# Patient Record
Sex: Male | Born: 1975 | Race: Black or African American | Hispanic: No | Smoking: Never smoker
Health system: Southern US, Community
[De-identification: ages and names within clinical notes are randomized; demographics above are authoritative.]

## PROBLEM LIST (undated history)

## (undated) DIAGNOSIS — I10 Essential (primary) hypertension: Secondary | ICD-10-CM

## (undated) HISTORY — DX: Essential (primary) hypertension: I10

---
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
## 1999-07-28 ENCOUNTER — Emergency Department (HOSPITAL_COMMUNITY): Admission: EM | Admit: 1999-07-28 | Discharge: 1999-07-28 | Payer: Self-pay | Admitting: Emergency Medicine

## 1999-08-01 ENCOUNTER — Emergency Department (HOSPITAL_COMMUNITY): Admission: EM | Admit: 1999-08-01 | Discharge: 1999-08-01 | Payer: Self-pay | Admitting: Internal Medicine

## 2004-12-22 ENCOUNTER — Emergency Department (HOSPITAL_COMMUNITY): Admission: EM | Admit: 2004-12-22 | Discharge: 2004-12-22 | Payer: Self-pay | Admitting: Emergency Medicine

## 2007-09-21 ENCOUNTER — Emergency Department (HOSPITAL_COMMUNITY): Admission: EM | Admit: 2007-09-21 | Discharge: 2007-09-21 | Payer: Self-pay | Admitting: Emergency Medicine

## 2007-09-22 ENCOUNTER — Emergency Department (HOSPITAL_COMMUNITY): Admission: EM | Admit: 2007-09-22 | Discharge: 2007-09-22 | Payer: Self-pay | Admitting: Emergency Medicine

## 2009-03-15 ENCOUNTER — Encounter: Admission: RE | Admit: 2009-03-15 | Discharge: 2009-03-15 | Payer: Self-pay | Admitting: Orthopaedic Surgery

## 2009-06-26 ENCOUNTER — Emergency Department (HOSPITAL_COMMUNITY): Admission: EM | Admit: 2009-06-26 | Discharge: 2009-06-26 | Payer: Self-pay | Admitting: Emergency Medicine

## 2014-01-21 ENCOUNTER — Encounter (HOSPITAL_COMMUNITY): Payer: Self-pay | Admitting: Emergency Medicine

## 2014-01-21 ENCOUNTER — Emergency Department (HOSPITAL_COMMUNITY)
Admission: EM | Admit: 2014-01-21 | Discharge: 2014-01-21 | Disposition: A | Discharge status: Home / Self Care | Payer: BC Managed Care – PPO | Attending: Emergency Medicine | Admitting: Emergency Medicine

## 2014-01-21 DIAGNOSIS — IMO0002 Reserved for concepts with insufficient information to code with codable children: Secondary | ICD-10-CM | POA: Insufficient documentation

## 2014-01-21 DIAGNOSIS — J0100 Acute maxillary sinusitis, unspecified: Secondary | ICD-10-CM | POA: Insufficient documentation

## 2014-01-21 DIAGNOSIS — Z79899 Other long term (current) drug therapy: Secondary | ICD-10-CM | POA: Insufficient documentation

## 2014-01-21 MED ORDER — PREDNISONE 10 MG PO TABS: 10.0000 mg | ORAL_TABLET | Freq: Every day | ORAL | Status: DC

## 2014-01-21 MED ORDER — IBUPROFEN 800 MG PO TABS
800.0000 mg | ORAL_TABLET | Freq: Once | ORAL | Status: AC
  Administered 2014-01-21: 800 mg via ORAL
  Filled 2014-01-21: qty 1

## 2014-01-21 NOTE — ED Provider Notes (Signed)
Medical screening examination/treatment/procedure(s) were performed by non-physician practitioner and as supervising physician I was immediately available for consultation/collaboration.    Lejuan Botto, MD 01/21/14 0518 

## 2014-01-21 NOTE — ED Notes (Signed)
Pt recently seen at dr for sinus pressure and given prednisone. Pt states that tonight the pressure began building back up and that he is unable to sleep. Pt reports that he can breath normal just the pressure is causing him pain.

## 2014-01-21 NOTE — Discharge Instructions (Signed)
Continue your medications to help with your sinus congestion. Use decongestant medicines to also help with your congestion. Followup with a primary care provider or an Ear, Nose and Throat specialist for continued evaluation and treatment.    Sinusitis Sinusitis is redness, soreness, and puffiness (inflammation) of the air pockets in the bones of your face (sinuses). The redness, soreness, and puffiness can cause air and mucus to get trapped in your sinuses. This can allow germs to grow and cause an infection.  HOME CARE   Drink enough fluids to keep your pee (urine) clear or pale yellow.  Use a humidifier in your home.  Run a hot shower to create steam in the bathroom. Sit in the bathroom with the door closed. Breathe in the steam 3 4 times a day.  Put a warm, moist washcloth on your face 3 4 times a day, or as told by your doctor.  Use salt water sprays (saline sprays) to wet the thick fluid in your nose. This can help the sinuses drain.  Only take medicine as told by your doctor. GET HELP RIGHT AWAY IF:   Your pain gets worse.  You have very bad headaches.  You are sick to your stomach (nauseous).  You throw up (vomit).  You are very sleepy (drowsy) all the time.  Your face is puffy (swollen).  Your vision changes.  You have a stiff neck.  You have trouble breathing. MAKE SURE YOU:   Understand these instructions.  Will watch your condition.  Will get help right away if you are not doing well or get worse. Document Released: 05/29/2008 Document Revised: 09/04/2012 Document Reviewed: 07/16/2012 [facility] Patient Information 2014 [place], [place].

## 2014-01-21 NOTE — ED Provider Notes (Signed)
CSN: [id number]     Arrival date & time 01/21/14  0131 History   First MD Initiated Contact with Patient 01/21/14 0155     Chief Complaint  Patient presents with  . Sinus Problem   HPI  History provided by the patient. Patient's 38 year old male who presents with complaints of return of left maxillary sinus pressure and pain. Patient states that he has a long history of some allergies and sinus problems. He has recently had sinus congestion and pressure mostly in the left maxillary sinus for the past several weeks. He was seen in urgent care one week ago. He has been using amoxicillin with 2 pills left as well as Flonase. He was also given a prednisone dose pack which he reports has helped significantly. His symptoms have returned with pressure and tightness this evening and early this morning. He denies having any significant nasal discharge or drainage. There is no associated cough symptoms. No fever, chills or sweats.    History reviewed. No pertinent past medical history. History reviewed. No pertinent past surgical history. No family history on file. History  Substance Use Topics  . Smoking status: Never Smoker   . Smokeless tobacco: Not on file  . Alcohol Use: No    Review of Systems  Constitutional: Negative for fever and chills.  HENT: Positive for sinus pressure.   Respiratory: Negative for cough.   All other systems reviewed and are negative.    Allergies  Review of patient's allergies indicates no known allergies.  Home Medications   Current Outpatient Rx  Name  Route  Sig  Dispense  Refill  . amoxicillin-clavulanate (AUGMENTIN) 875-125 MG per tablet   Oral   Take 1 tablet by mouth 2 (two) times daily.         . fluticasone (FLONASE) 50 MCG/ACT nasal spray   Nasal   Place 1 spray into the nose daily.         . sodium chloride (OCEAN) 0.65 % SOLN nasal spray   Each Nare   Place 1 spray into both nostrils at bedtime.          BP 134/77  Pulse 54   Temp(Src) 97.5 F (36.4 C) (Oral)  Resp 16  SpO2 100% Physical Exam  Nursing note and vitals reviewed. Constitutional: He is oriented to person, place, and time. He appears well-developed and well-nourished. No distress.  HENT:  Head: Normocephalic.  Right Ear: Tympanic membrane normal.  Left Ear: Tympanic membrane normal.  Nose: No mucosal edema or rhinorrhea. Left sinus exhibits maxillary sinus tenderness.  Mouth/Throat: Oropharynx is clear and moist.  Cardiovascular: Normal rate and regular rhythm.   Pulmonary/Chest: Effort normal and breath sounds normal. No respiratory distress. He has no wheezes.  Neurological: He is alert and oriented to person, place, and time.  Skin: Skin is warm.  Psychiatric: He has a normal mood and affect. His behavior is normal.    ED Course  Procedures   DIAGNOSTIC STUDIES: Oxygen Saturation is 100% on room air.    COORDINATION OF CARE:  Nursing notes reviewed. Vital signs reviewed. Initial pt interview and examination performed.   2:11 AM-patient seen and evaluated. He is well-appearing no acute distress.he does not appear severely ill or toxic.    MDM   1. Sinusitis, acute, maxillary        Angus Sellereter S Mehgan Santmyer, PA-C 01/21/14 0222

## 2014-12-30 ENCOUNTER — Encounter (HOSPITAL_COMMUNITY): Payer: Self-pay | Admitting: Emergency Medicine

## 2014-12-30 ENCOUNTER — Emergency Department (HOSPITAL_COMMUNITY)
Admission: EM | Admit: 2014-12-30 | Discharge: 2014-12-30 | Disposition: A | Discharge status: Home / Self Care | Payer: BC Managed Care – PPO | Source: Home / Self Care | Attending: Family Medicine | Admitting: Family Medicine

## 2014-12-30 DIAGNOSIS — L02214 Cutaneous abscess of groin: Secondary | ICD-10-CM

## 2014-12-30 MED ORDER — SULFAMETHOXAZOLE-TRIMETHOPRIM 800-160 MG PO TABS: 1.0000 | ORAL_TABLET | Freq: Two times a day (BID) | ORAL | Status: AC

## 2014-12-30 NOTE — ED Notes (Signed)
Reports possible staff infection of the left groin.  On set Sunday.  Denies any other symptoms.  No fever.  No otc treatments tried.

## 2014-12-30 NOTE — Discharge Instructions (Signed)
Abscess An abscess (boil or furuncle) is an infected area on or under the skin. This area is filled with yellowish-white fluid (pus) and other material (debris). HOME CARE   Only take medicines as told by your doctor.  If you were given antibiotic medicine, take it as directed. Finish the medicine even if you start to feel better.  If gauze is used, follow your doctor's directions for changing the gauze.  To avoid spreading the infection:  Keep your abscess covered with a bandage.  Wash your hands well.  Do not share personal care items, towels, or whirlpools with others.  Avoid skin contact with others.  Keep your skin and clothes clean around the abscess.  Keep all doctor visits as told. GET HELP RIGHT AWAY IF:   You have more pain, puffiness (swelling), or redness in the wound site.  You have more fluid or blood coming from the wound site.  You have muscle aches, chills, or you feel sick.  You have a fever. MAKE SURE YOU:   Understand these instructions.  Will watch your condition.  Will get help right away if you are not doing well or get worse. Document Released: 05/29/2008 Document Revised: 06/11/2012 Document Reviewed: 02/23/2012 [facility] Patient Information 2015 [place], [place]. This information is not intended to replace advice given to you by your health care provider. Make sure you discuss any questions you have with your health care provider.  Community-Associated MRSA CA-MRSA stands for community-associated methicillin-resistant Staphylococcus aureus. MRSA is a type of bacteria that is resistant to some common antibiotics. It can cause infections in the skin and many other places in the body. Staphylococcus aureus, often called "staph," is a bacteria that normally lives on the skin or in the nose. Staph on the surface of the skin or in the nose does not cause problems. However, if the staph enters the body through a cut, wound, or break in the skin, an  infection can happen. Up until recently, infections with the MRSA type of staph mainly occurred in hospitals and other health care settings. There are now increasing problems with MRSA infections in the community as well. Infections with MRSA may be very serious or even life threatening. CA-MRSA is becoming more common. It is known to spread in crowded settings, in jails and prisons, and in situations where there is close skin-to-skin contact, such as during sporting events or in locker rooms. MRSA can be spread through shared items, such as children's toys, razors, towels, or sports equipment.  CAUSES All staph, including MRSA, are normally harmless unless they enter the body through a scratch, cut, or wound, such as with surgery. All staph, including MRSA, can be spread from person-to-person by touching contaminated objects or through direct contact.  MRSA now causes illness in people who have not been in hospitals or other health care facilities. Cases of MRSA diseases in the community have been associated with:  Recent antibiotic use.  Sharing contaminated towels or clothes.  Having active skin diseases.  Participating in contact sports.  Living in crowded settings.  Intravenous (IV) drug use.  Community-associated MRSA infections are usually skin infections, but may cause other severe illnesses.  Staph bacteria are one of the most common causes of skin infection. However, they are also a common cause of pneumonia, bone or joint infections, and bloodstream infections. DIAGNOSIS Diagnosis of MRSA is done by cultures of fluid samples that may come from:  Swabs taken from cuts or wounds in infected areas.  Nasal  swabs.  Saliva or deep cough specimens from the lungs (sputum).  Urine.  Blood. Many people are "colonized" with MRSA but have no signs of infection. This means that people carry the MRSA germ on their skin or in their nose and may never develop MRSA infection.  TREATMENT   Warm Moist Compresses Frequently Treatment varies and is based on how serious, how deep, or how extensive the infection is. For example:  Some skin infections, such as a small boil or abscess, may be treated by draining yellowish-white fluid (pus) from the site of the infection.  Deeper or more widespread soft tissue infections are usually treated with surgery to drain pus and with antibiotic medicine given by vein or by mouth. This may be recommended even if you are pregnant.  Serious infections may require a hospital stay. If antibiotics are given, they may be needed for several weeks. PREVENTION Because many people are colonized with staph, including MRSA, preventing the spread of the bacteria from person-to-person is most important. The best way to prevent the spread of bacteria and other germs is through proper hand washing or by using alcohol-based hand disinfectants. The following are other ways to help prevent MRSA infection within community settings.   Wash your hands frequently with soap and water for at least 15 seconds. Otherwise, use alcohol-based hand disinfectants when soap and water is not available.  Make sure people who live with you wash their hands often, too.  Do not share personal items. For example, avoid sharing razors and other personal hygiene items, towels, clothing, and athletic equipment.  Wash and dry your clothes and bedding at the warmest temperatures recommended on the labels.  Keep wounds covered. Pus from infected sores may contain MRSA and other bacteria. Keep cuts and abrasions clean and covered with germ-free (sterile), dry bandages until they are healed.  If you have a wound that appears infected, ask your caregiver if a culture for MRSA and other bacteria should be done.  If you are breastfeeding, talk to your caregiver about MRSA. You may be asked to temporarily stop breastfeeding. HOME CARE INSTRUCTIONS   Take your antibiotics as directed. Finish  them even if you start to feel better.  Avoid close contact with those around you as much as possible. Do not use towels, razors, toothbrushes, bedding, or other items that will be used by others.  To fight the infection, follow your caregiver's instructions for wound care. Wash your hands before and after changing your bandages.  If you have an intravascular device, such as a catheter, make sure you know how to care for it.  Be sure to tell any health care providers that you have MRSA so they are aware of your infection. SEEK IMMEDIATE MEDICAL CARE IF:  The infection appears to be getting worse. Signs include:  Increased warmth, redness, or tenderness around the wound site.  A red line that extends from the infection site.  A dark color in the area around the infection.  Wound drainage that is tan, yellow, or green.  A bad smell coming from the wound.  You feel sick to your stomach (nauseous) and throw up (vomit) or cannot keep medicine down.  You have a fever.  Your baby is older than 3 months with a rectal temperature of 102F (38.9C) or higher.  Your baby is 45 months old or younger with a rectal temperature of 100.56F (38C) or higher.  You have difficulty breathing. MAKE SURE YOU:   Understand these instructions.  Will watch your condition.  Will get help right away if you are not doing well or get worse. Document Released: 03/16/2006 Document Revised: 04/27/2014 Document Reviewed: 03/16/2011 [facility] Patient Information 2015 [place], [place]. This information is not intended to replace advice given to you by your health care provider. Make sure you discuss any questions you have with your health care provider.

## 2014-12-30 NOTE — ED Provider Notes (Signed)
CSN: [id number]     Arrival date & time 12/30/14  1235 History   First MD Initiated Contact with Patient 12/30/14 1254     Chief Complaint  Patient presents with  . Groin Pain     possible staff infection per pt   (Consider location/radiation/quality/duration/timing/severity/associated sxs/prior Treatment) HPI Comments: 39 year old male presents with a firm, indurated tender area to the left groin that began approximately 3 days ago as a small pimple. During this time it had drained a small amount of pus. The patient states it is not as large as it was. It is located in the left groin crease.   History reviewed. No pertinent past medical history. History reviewed. No pertinent past surgical history. History reviewed. No pertinent family history. History  Substance Use Topics  . Smoking status: Never Smoker   . Smokeless tobacco: Not on file  . Alcohol Use: No    Review of Systems  Constitutional: Negative for fever.  Skin: Negative for color change.  All other systems reviewed and are negative.   Allergies  Review of patient's allergies indicates no known allergies.  Home Medications   Prior to Admission medications   Medication Sig Start Date End Date Taking? Authorizing Provider  amoxicillin-clavulanate (AUGMENTIN) 875-125 MG per tablet Take 1 tablet by mouth 2 (two) times daily.    Historical Provider, MD  fluticasone (FLONASE) 50 MCG/ACT nasal spray Place 1 spray into the nose daily.    Historical Provider, MD  predniSONE (DELTASONE) 10 MG tablet Take 1 tablet (10 mg total) by mouth daily. Take 6 tabs on day one, take 5 tablets on day 2, take 4 tabs on day 3, take 3 tabs on day 4, take 2 tabs on day 5, take 1 tab Monday 6 01/21/14   Phill MutterPeter S Dammen, PA-C  sodium chloride (OCEAN) 0.65 % SOLN nasal spray Place 1 spray into both nostrils at bedtime.    Historical Provider, MD  sulfamethoxazole-trimethoprim (BACTRIM DS,SEPTRA DS) 800-160 MG per tablet Take 1 tablet by mouth 2  (two) times daily. 12/30/14 01/06/15  Hayden Rasmussenavid Markas Aldredge, NP   BP 113/72 mmHg  Pulse 71  Temp(Src) 98.2 F (36.8 C) (Oral)  Resp 16  SpO2 99% Physical Exam  Constitutional: He is oriented to person, place, and time. He appears well-developed and well-nourished. No distress.  Pulmonary/Chest: Effort normal. No respiratory distress.  Musculoskeletal: Normal range of motion. He exhibits no edema.  Neurological: He is alert and oriented to person, place, and time.  Skin: Skin is warm and dry.  There is a irregularly shaped approximately 2.5 cm x 4 cm area of superficial induration located in the left inguinal crease. It is slightly mobile and approximately 1 cm in depth. There is no area of drainage or bleeding.. No overlying erythema or lymphangitis. The genitals are not involved.  Nursing note and vitals reviewed.   ED Course  Procedures (including critical care time) Labs Review Labs Reviewed - No data to display  Imaging Review No results found.   MDM   1. Abscess of groin, left    Warm compresses Septra x 7 d Return if not improving or worse Position, appearance, size and palpation does not suggest this would benefit from I and D at this stage. Appears to be slowly resolving.      Hayden Rasmussenavid Lanett Lasorsa, NP 12/30/14 1346

## 2015-06-13 ENCOUNTER — Emergency Department (HOSPITAL_COMMUNITY)
Admission: EM | Admit: 2015-06-13 | Discharge: 2015-06-13 | Disposition: A | Discharge status: Home / Self Care | Payer: BC Managed Care – PPO | Attending: Emergency Medicine | Admitting: Emergency Medicine

## 2015-06-13 ENCOUNTER — Encounter (HOSPITAL_COMMUNITY): Payer: Self-pay | Admitting: Emergency Medicine

## 2015-06-13 DIAGNOSIS — R0981 Nasal congestion: Secondary | ICD-10-CM | POA: Insufficient documentation

## 2015-06-13 MED ORDER — FLUTICASONE PROPIONATE 50 MCG/ACT NA SUSP: 2.0000 | Freq: Every day | NASAL | Status: AC

## 2015-06-13 NOTE — ED Notes (Signed)
Pt arrived to the ED with a complaint of difficulty breathing.  Pt states he woke up with his nasal pasagge blocked and had difficulty breathing through his mouth.  Pt states incidences of this type have happen before but never to the effect of restricting his breathing.

## 2015-06-13 NOTE — ED Provider Notes (Signed)
CSN: [id number]     Arrival date & time 06/13/15  0344 History   First MD Initiated Contact with Patient 06/13/15 [phone]     Chief Complaint  Patient presents with  . Nasal Congestion     (Consider location/radiation/quality/duration/timing/severity/associated sxs/prior Treatment) The history is provided by medical records and the patient.    This is a 39 y.o. M with no significant PMH presenting to the ED for nasal congestion.  He states last night he awoke and his nose was "clogged up" and he could not breath through his nose.  He states this has happened before, but never in the middle of the night.  He states it scared him so he decided to come in.  He denies chest pain, SOB, palpitations, dizziness, weakness, cough, fever, sweats, chills, sore throat, ear pain, or PND.  No sick contacts. Patient did try saline nasal spray without relief.  VSS.  History reviewed. No pertinent past medical history. History reviewed. No pertinent past surgical history. History reviewed. No pertinent family history. History  Substance Use Topics  . Smoking status: Never Smoker   . Smokeless tobacco: Not on file  . Alcohol Use: No    Review of Systems  HENT: Positive for congestion.   All other systems reviewed and are negative.     Allergies  Review of patient's allergies indicates no known allergies.  Home Medications   Prior to Admission medications   Medication Sig Start Date End Date Taking? Authorizing Provider  sodium chloride (OCEAN) 0.65 % SOLN nasal spray Place 1 spray into both nostrils at bedtime.   Yes Historical Provider, MD   BP 123/81 mmHg  Pulse 70  Temp(Src) 98.1 F (36.7 C) (Oral)  Resp 20  SpO2 99%   Physical Exam  Constitutional: He is oriented to person, place, and time. He appears well-developed and well-nourished.  HENT:  Head: Normocephalic and atraumatic.  Right Ear: Tympanic membrane and ear canal normal.  Left Ear: Tympanic membrane and ear canal normal.   Nose: Mucosal edema present.  Mouth/Throat: Uvula is midline, oropharynx is clear and moist and mucous membranes are normal.  Turbinates swollen and mildly erythematous  Eyes: Conjunctivae and EOM are normal. Pupils are equal, round, and reactive to light.  Neck: Normal range of motion. Neck supple.  Cardiovascular: Normal rate, regular rhythm and normal heart sounds.   Pulmonary/Chest: Effort normal and breath sounds normal. No respiratory distress. He has no wheezes.  Musculoskeletal: Normal range of motion.  Neurological: He is alert and oriented to person, place, and time.  Skin: Skin is warm and dry.  Psychiatric: He has a normal mood and affect.  Nursing note and vitals reviewed.   ED Course  Procedures (including critical care time) Labs Review Labs Reviewed - No data to display  Imaging Review No results found.   EKG Interpretation None      MDM   Final diagnoses:  Nasal congestion   39 year old male here with nasal congestion that began in the middle the night. He states his nasal passages feel "clogged" and he is in blowing his nose frequently. He denies any chest pain, shortness of breath, cough, or fever. He does have signs of nasal congestion on exam, however doubt acute infectious pathology. Patient will be started on Flonase. He was encouraged follow-up with a primary care physician.  Discussed plan with patient, he/she acknowledged understanding and agreed with plan of care.  Return precautions given for new or worsening symptoms.  Garlon Hatchet,  PA-C 06/13/15 1194  Mirian Mo, MD 06/14/15 [phone]

## 2015-06-13 NOTE — Discharge Instructions (Signed)
Take the prescribed medication as directed. Follow-up with a primary care physician or the wellness clinic if continue having issues with nasal congestion.

## 2020-12-04 ENCOUNTER — Other Ambulatory Visit: Payer: Self-pay

## 2020-12-04 ENCOUNTER — Emergency Department
Admission: EM | Admit: 2020-12-04 | Discharge: 2020-12-04 | Disposition: A | Discharge status: Home / Self Care | Payer: BC Managed Care – PPO | Attending: Emergency Medicine | Admitting: Emergency Medicine

## 2020-12-04 ENCOUNTER — Emergency Department: Payer: BC Managed Care – PPO

## 2020-12-04 DIAGNOSIS — R0789 Other chest pain: Secondary | ICD-10-CM | POA: Diagnosis present

## 2020-12-04 DIAGNOSIS — R079 Chest pain, unspecified: Secondary | ICD-10-CM

## 2020-12-04 DIAGNOSIS — I10 Essential (primary) hypertension: Secondary | ICD-10-CM | POA: Diagnosis not present

## 2020-12-04 LAB — BASIC METABOLIC PANEL
Anion gap: 9 (ref 5–15)
BUN: 19 mg/dL (ref 6–20)
CO2: 23 mmol/L (ref 22–32)
Calcium: 9.3 mg/dL (ref 8.9–10.3)
Chloride: 105 mmol/L (ref 98–111)
Creatinine, Ser: 1.06 mg/dL (ref 0.61–1.24)
GFR, Estimated: >60 mL/min (ref >60)
Glucose, Bld: 108 mg/dL — ABNORMAL HIGH (ref 70–99)
Potassium: 3.9 mmol/L (ref 3.5–5.1)
Sodium: 137 mmol/L (ref 135–145)

## 2020-12-04 LAB — CBC
HCT: 41.6 % (ref 39.0–52.0)
Hemoglobin: 14.3 g/dL (ref 13.0–17.0)
MCH: 27.8 pg (ref 26.0–34.0)
MCHC: 34.4 g/dL (ref 30.0–36.0)
MCV: 80.8 fL (ref 80.0–100.0)
Platelets: 239 10*3/uL (ref 150–400)
RBC: 5.15 MIL/uL (ref 4.22–5.81)
RDW: 13.4 % (ref 11.5–15.5)
WBC: 8.7 10*3/uL (ref 4.0–10.5)
nRBC: 0 % (ref 0.0–0.2)

## 2020-12-04 LAB — TROPONIN I (HIGH SENSITIVITY): Troponin I (High Sensitivity): 17 ng/L (ref <18)

## 2020-12-04 NOTE — ED Triage Notes (Signed)
Pt to ED POV for chief complaint of left sided chest tightness that started within the last hour with [facility] Denies N/V, dizziness Ambulatory to triage, NAD  Speaking in complete sentences, RR even and unlabored

## 2020-12-04 NOTE — ED Notes (Signed)
Pt ambulatory to treatment room. Steady gait noted.

## 2020-12-04 NOTE — ED Provider Notes (Signed)
[facility] Emergency Department Provider Note  ____________________________________________   Event Date/Time   First MD Initiated Contact with Patient 12/04/20 1804     (approximate)  I have reviewed the triage vital signs and the nursing notes.   HISTORY  Chief Complaint Chest Pain   HPI Joshua Lam is a 44 y.o. male without significant past medical history who presents for assessment of some left-sided chest tightness states with some shortness of breath and tingling in his neck that started about an hour prior to arrival.  Patient states the symptoms have resolved.  No prior similar episodes.  No clear alleviating aggravating factors any states he was driving when it began.  He does note he worked out earlier today and took his regular preworkout supplement including nitric oxide mix and something for "focus" he does not know the ingredients of.  He denies any other acute sick symptoms or current symptoms.  Denies any headache, earache, sore throat, fevers, chills, cough, nausea, vomiting, diarrhea, dysuria, rash or extremity pain.  No recent falls or injuries.  He denies tobacco abuse EtOH use or illicit drug use.         History reviewed. No pertinent past medical history.  There are no problems to display for this patient.   History reviewed. No pertinent surgical history.  Prior to Admission medications   Medication Sig Start Date End Date Taking? Authorizing Provider  fluticasone (FLONASE) 50 MCG/ACT nasal spray Place 2 sprays into both nostrils daily. 06/13/15   Garlon Hatchet, PA-C  sodium chloride (OCEAN) 0.65 % SOLN nasal spray Place 1 spray into both nostrils at bedtime.    [provider]    Allergies Patient has no known allergies.  No family history on file.  Social History Social History   Tobacco Use  . Smoking status: Never Smoker  Substance Use Topics  . Alcohol use: No  . Drug use: No    Review of  Systems  Review of Systems  Constitutional: Negative for chills and fever.  HENT: Negative for sore throat.   Eyes: Negative for pain.  Respiratory: Positive for shortness of breath. Negative for cough and stridor.   Cardiovascular: Positive for chest pain.  Gastrointestinal: Negative for vomiting.  Genitourinary: Negative for dysuria.  Musculoskeletal: Negative for myalgias.  Skin: Negative for rash.  Neurological: Negative for seizures, loss of consciousness and headaches.  Psychiatric/Behavioral: Negative for suicidal ideas.  All other systems reviewed and are negative.     ____________________________________________   PHYSICAL EXAM:  VITAL SIGNS: ED Triage Vitals  Enc Vitals Group     BP 12/04/20 1711 (!) 149/100     Pulse Rate 12/04/20 1711 86     Resp 12/04/20 1711 18     Temp 12/04/20 1711 98.6 F (37 C)     Temp Source 12/04/20 1711 Oral     SpO2 12/04/20 1711 97 %     Weight 12/04/20 1712 240 lb (108.9 kg)     Height 12/04/20 1712 5\' 11"  (1.803 m)     Head Circumference --      Peak Flow --      Pain Score 12/04/20 1712 0     Pain Loc --      Pain Edu? --      Excl. in GC? --    Vitals:   12/04/20 1711  BP: (!) 149/100  Pulse: 86  Resp: 18  Temp: 98.6 F (37 C)  SpO2: 97%   Physical  Exam Vitals and nursing note reviewed.  Constitutional:      Appearance: He is well-developed and well-nourished.  HENT:     Head: Normocephalic and atraumatic.     Right Ear: External ear normal.     Left Ear: External ear normal.     Nose: Nose normal.  Eyes:     Conjunctiva/sclera: Conjunctivae normal.  Cardiovascular:     Rate and Rhythm: Normal rate and regular rhythm.     Heart sounds: No murmur heard.   Pulmonary:     Effort: Pulmonary effort is normal. No respiratory distress.     Breath sounds: Normal breath sounds.  Abdominal:     Palpations: Abdomen is soft.     Tenderness: There is no abdominal tenderness.  Musculoskeletal:        General: No  edema.     Cervical back: Neck supple.  Skin:    General: Skin is warm and dry.     Capillary Refill: Capillary refill takes less than 2 seconds.  Neurological:     Mental Status: He is alert and oriented to person, place, and time.  Psychiatric:        Mood and Affect: Mood and affect and mood normal.      ____________________________________________   LABS (all labs ordered are listed, but only abnormal results are displayed)  Labs Reviewed  BASIC METABOLIC PANEL - Abnormal; Notable for the following components:      Result Value   Glucose, Bld 108 (*)    All other components within normal limits  CBC  TROPONIN I (HIGH SENSITIVITY)  TROPONIN I (HIGH SENSITIVITY)   ____________________________________________  EKG  Sinus rhythm with a ventricular rate of 86, normal axis, unremarkable intervals, T wave inversions in leads III, aVF and as nonspecific ST changes in leads V5 and V6. ____________________________________________  RADIOLOGY  ED MD interpretation: No focal consolidation, large effusion, no thorax, edema, or other acute thoracic process.  Official radiology report(s): DG Chest 2 View  Result Date: 12/04/2020 CLINICAL DATA:  Shortness of breath EXAM: CHEST - 2 VIEW COMPARISON:  None. FINDINGS: The heart size and mediastinal contours are within normal limits. Both lungs are clear. The visualized skeletal structures are unremarkable. IMPRESSION: No acute abnormality of the lungs. Electronically Signed   By: Lauralyn Primes M.D.   On: 12/04/2020 18:01    ____________________________________________   PROCEDURES  Procedure(s) performed (including Critical Care):  .1-3 Lead EKG Interpretation Performed by: Gilles Chiquito, MD Authorized by: Gilles Chiquito, MD     Interpretation: normal     ECG rate assessment: normal     Rhythm: sinus rhythm     Ectopy: none     Conduction: normal       ____________________________________________   INITIAL  IMPRESSION / ASSESSMENT AND PLAN / ED COURSE      Patient presents with post history exam for assessment of some chest pain shortness of breath that occurred earlier as above but resolved prior to arrival.  On arrival patient is hypertensive with BP of 139/100 with otherwise stable vital signs on room air.  Primary differential includes but is not limited to ACS, arrhythmia, PE, metabolic derangements, pneumothorax, pneumonia, anemia, and chest wall pain.  Description of pain is not consistent with myocarditis or pericarditis or recent traumatic injury  Chest x-ray shows evidence of pneumothorax or pneumonia.  CBC is unremarkable and has no evidence of significant anemia.  BMP is unremarkable.  While ECG does have some nonspecific changes  initial troponin is nonelevated. I will plan to obtain a repeat troponin.   No significant arrhythmia identified.  Low suspicion for PE as patient is PERC negative.  Troponins not consistent with myocarditis.  Pain is not clearly reproducible on exam I suspect may be coming from his chest wall a possible musculoskeletal etiology. Prior to obtaining second troponin patient stated he wished to leave against advice. He stated he understood could be missing evidence of cardiac ischemia but still wished to leave prior to obtaining a second troponin. We did everything I could recommend patient to stay and explained the concern for possible life-threatening etiologies for his pain he experienced earlier today but was able to do so. Do believe patient has capacity he was discharged in stable condition. Advised patient to follow-up with PCP and have patient's blood pressure rechecked as well in 7 to 10 days.  ____________________________________________   FINAL CLINICAL IMPRESSION(S) / ED DIAGNOSES  Final diagnoses:  Chest pain, unspecified type  Hypertension, unspecified type    Medications - No data to display   ED Discharge Orders    None       Note:  This  document was prepared using Dragon voice recognition software and may include unintentional dictation errors.   Gilles Chiquito, MD 12/04/20 [phone]

## 2022-05-03 ENCOUNTER — Encounter (HOSPITAL_COMMUNITY): Payer: Self-pay

## 2022-05-03 ENCOUNTER — Other Ambulatory Visit: Payer: Self-pay

## 2022-05-03 ENCOUNTER — Emergency Department (HOSPITAL_COMMUNITY)
Admission: EM | Admit: 2022-05-03 | Discharge: 2022-05-03 | Disposition: A | Discharge status: Home / Self Care | Payer: Self-pay | Attending: Emergency Medicine | Admitting: Emergency Medicine

## 2022-05-03 ENCOUNTER — Emergency Department (HOSPITAL_COMMUNITY): Payer: Self-pay

## 2022-05-03 DIAGNOSIS — R1032 Left lower quadrant pain: Secondary | ICD-10-CM | POA: Insufficient documentation

## 2022-05-03 DIAGNOSIS — R202 Paresthesia of skin: Secondary | ICD-10-CM | POA: Insufficient documentation

## 2022-05-03 LAB — CBC WITH DIFFERENTIAL/PLATELET
Abs Immature Granulocytes: 0.02 10*3/uL (ref 0.00–0.07)
Basophils Absolute: 0 10*3/uL (ref 0.0–0.1)
Basophils Relative: 1 %
Eosinophils Absolute: 0.3 10*3/uL (ref 0.0–0.5)
Eosinophils Relative: 4 %
HCT: 43 % (ref 39.0–52.0)
Hemoglobin: 14.1 g/dL (ref 13.0–17.0)
Immature Granulocytes: 0 %
Lymphocytes Relative: 42 %
Lymphs Abs: 3.1 10*3/uL (ref 0.7–4.0)
MCH: 27.9 pg (ref 26.0–34.0)
MCHC: 32.8 g/dL (ref 30.0–36.0)
MCV: 85 fL (ref 80.0–100.0)
Monocytes Absolute: 0.7 10*3/uL (ref 0.1–1.0)
Monocytes Relative: 9 %
Neutro Abs: 3.3 10*3/uL (ref 1.7–7.7)
Neutrophils Relative %: 44 %
Platelets: 231 10*3/uL (ref 150–400)
RBC: 5.06 MIL/uL (ref 4.22–5.81)
RDW: 13.7 % (ref 11.5–15.5)
WBC: 7.3 10*3/uL (ref 4.0–10.5)
nRBC: 0 % (ref 0.0–0.2)

## 2022-05-03 LAB — COMPREHENSIVE METABOLIC PANEL
ALT: 17 U/L (ref 0–44)
AST: 30 U/L (ref 15–41)
Albumin: 3.7 g/dL (ref 3.5–5.0)
Alkaline Phosphatase: 77 U/L (ref 38–126)
Anion gap: 7 (ref 5–15)
BUN: 17 mg/dL (ref 6–20)
CO2: 24 mmol/L (ref 22–32)
Calcium: 9.4 mg/dL (ref 8.9–10.3)
Chloride: 107 mmol/L (ref 98–111)
Creatinine, Ser: 1.13 mg/dL (ref 0.61–1.24)
GFR, Estimated: >60 mL/min (ref >60)
Glucose, Bld: 98 mg/dL (ref 70–99)
Potassium: 4.8 mmol/L (ref 3.5–5.1)
Sodium: 138 mmol/L (ref 135–145)
Total Bilirubin: 0.3 mg/dL (ref 0.3–1.2)
Total Protein: 6.9 g/dL (ref 6.5–8.1)

## 2022-05-03 NOTE — ED Provider Triage Note (Signed)
Emergency Medicine Provider Triage Evaluation Note ? ?Joshua Lam , a 46 y.o. male  was evaluated in triage.  Pt complains of tingling in left foot. He reports the pain/tingling goes from his lateral left thigh and radiates down unto his foot. Denies any weakness. Reports he can walk with ease. Denies any fevers. He reports he was adjusted at his chiropractor's office a few days prior. Denies any urinary retention, urinary/fecal incontinence, or saddle anesthesia. Does have a h/o sciatica. ? ?Review of Systems  ?Positive:  ?Negative:  ? ?Physical Exam  ?BP (!) 148/100 (BP Location: Right Arm)   Pulse 75   Temp 98.3 ?F (36.8 ?C) (Oral)   Resp 18   SpO2 97%  ?Gen:   Awake, no distress   ?Resp:  Normal effort  ?MSK:   Moves extremities without difficulty. Compartments are soft. DP and PT pulses intact. No edema noted.  ?Other:  Cranial nerves II-XII intact. Finger to nose normal. Coordination normal. Ambulatory with ease. Sensation intact throughout.  ? ?Medical Decision Making  ?Medically screening exam initiated at 5:28 AM.  Appropriate orders placed.  [name] was informed that the remainder of the evaluation will be completed by another provider, this initial triage assessment does not replace that evaluation, and the importance of remaining in the ED until their evaluation is complete. ? ?Doubt stroke as the patient only has tingling pain but no sensation deficit. Likely sciatica. Will order DG lumbar and basic labs.  ?  [name], PA-C ?05/03/22 3474 ? ?

## 2022-05-03 NOTE — Discharge Instructions (Signed)
Please call number in your discharge papers for outpatient follow-up.  You need to have your blood pressure rechecked in outpatient setting ?Return to the emergency department if you are having return of symptoms especially if they are worsening such as weakness, occultly speaking or changes in vision. ?

## 2022-05-03 NOTE — ED Triage Notes (Signed)
Pt reports that he is here today due to tingling in his left leg. Neuro exam wdl. Pt denies any there pain. Pt reports he went to the chiropractor office on Friday. Pt reports the tingling started this morning and woke him from his sleep ?

## 2022-05-03 NOTE — ED Notes (Signed)
Sitting up on stretcher. No signs of distress. Awaiting evaluation by provider.  ?

## 2022-05-03 NOTE — ED Provider Notes (Signed)
?[facility] EMERGENCY DEPARTMENT ?Provider Note ? ? ?CSN: [id number] ?Arrival date & time: 05/03/22  3976 ? ?  ? ?History ? ?Chief Complaint  ?Patient presents with  ? Tingling  ? ? ?Joshua Lam is a 46 y.o. male. ? ?HPI ?46 year old male presents today complaining of left lower extremity tingling that was present on awakening this morning.  He denies any weakness.  He describes the tingling is in the lateral lower lateral left leg.  It has resolved at this time.  He denies any vision changes, difficulty speaking, arm weakness, change in ability to walk or change in strength in lower extremities.  He has not had any bowel or bladder control issues.  States he did note a little bit of pain in his left lower quadrant before this occurred.  He states that he has had some episodes of sciatica and he has been followed by a chiropractor.  He has not been followed by primary care physician does not think that he has any baseline medical problems. ? ?  ? ?Home Medications ?Prior to Admission medications   ?Medication Sig Start Date End Date Taking? Authorizing Provider  ?fluticasone (FLONASE) 50 MCG/ACT nasal spray Place 2 sprays into both nostrils daily. 06/13/15   Garlon Hatchet, PA-C  ?sodium chloride (OCEAN) 0.65 % SOLN nasal spray Place 1 spray into both nostrils at bedtime.    [provider]  ?   ? ?Allergies    ?Patient has no known allergies.   ? ?Review of Systems   ?Review of Systems  ?All other systems reviewed and are negative. ? ?Physical Exam ?Updated Vital Signs ?BP (!) 147/93 (BP Location: Left Arm)   Pulse (!) 57   Temp 98 ?F (36.7 ?C) (Oral)   Resp 14   Ht 1.803 m (5\' 11" )   Wt 104.3 kg   SpO2 95%   BMI 32.08 kg/m?  ?Physical Exam ?Vitals and nursing note reviewed.  ?Constitutional:   ?   General: He is not in acute distress. ?   Appearance: Normal appearance. He is not ill-appearing.  ?HENT:  ?   Head: Normocephalic.  ?   Right Ear: External ear normal.  ?   Left Ear:  External ear normal.  ?   Nose: Nose normal.  ?   Mouth/Throat:  ?   Mouth: Mucous membranes are moist.  ?   Pharynx: Oropharynx is clear.  ?Eyes:  ?   Extraocular Movements: Extraocular movements intact.  ?   Pupils: Pupils are equal, round, and reactive to light.  ?Cardiovascular:  ?   Rate and Rhythm: Normal rate and regular rhythm.  ?   Pulses: Normal pulses.  ?Pulmonary:  ?   Effort: Pulmonary effort is normal.  ?Abdominal:  ?   Palpations: Abdomen is soft.  ?Musculoskeletal:     ?   General: No swelling or tenderness. Normal range of motion.  ?   Cervical back: Normal range of motion.  ?Skin: ?   General: Skin is warm and dry.  ?   Capillary Refill: Capillary refill takes less than 2 seconds.  ?Neurological:  ?   General: No focal deficit present.  ?   Mental Status: He is alert.  ?   Cranial Nerves: No cranial nerve deficit.  ?   Sensory: No sensory deficit.  ?   Motor: No weakness.  ?   Coordination: Coordination normal.  ?   Gait: Gait normal.  ?   Deep Tendon Reflexes: Reflexes  normal.  ?   Comments: Strength 5 out of 5 left foot plantar dorsiflexion of great toe, plantar dorsiflexion of the left ankle, lection extension of the knee and hip ?No sensory deficit noted on exam  ? ? ?ED Results / Procedures / Treatments   ?Labs ?(all labs ordered are listed, but only abnormal results are displayed) ?Labs Reviewed  ?CBC WITH DIFFERENTIAL/PLATELET  ?COMPREHENSIVE METABOLIC PANEL  ? ? ?EKG ?None ? ?Radiology ?DG Lumbar Spine Complete ? ?Result Date: 05/03/2022 ?CLINICAL DATA:  Radiculopathy in left leg. EXAM: LUMBAR SPINE - COMPLETE 4+ VIEW COMPARISON:  None Available. FINDINGS: The alignment of the lumbar spine appears normal. The vertebral body heights are well preserved. No signs of acute fracture or dislocation. Possible early L5, nondisplaced left-sided pars defect. No signs of anterolisthesis. Mild disc space narrowing and posterior endplate spurring noted at L1-2. IMPRESSION: 1. No acute findings. 2. Mild  degenerative disc disease at L1-2. 3. Possible early, nondisplaced left L5 pars defect. No signs of anterolisthesis. Electronically Signed   By: Signa Kell M.D.   On: 05/03/2022 06:08   ? ?Procedures ?Procedures  ? ? ?Medications Ordered in ED ?Medications - No data to display ? ?ED Course/ Medical Decision Making/ A&P ?Clinical Course as of 05/03/22 0851  ?Wed May 03, 2022  ?0850 Lumbar spine x-rays reviewed and interpreted with no acute findings ?Radiologist interpretation reviewed [DR]  ?0017 Comprehensive metabolic panel ?Complete metabolic panel reviewed and interpreted and within normal limits [DR]  ?4944 CBC with Differential ?Complete blood count reviewed interpreted and within normal limits [DR]  ?  ?Clinical Course User Index ?[DR] Margarita Grizzle, MD  ? ?                        ?Medical Decision Making ?46 year old male no significant past medical history except for sciatica presents today complaining of tingling that he noted in his left lower extremity on awakening this morning.  It has since resolved.  There were no motor deficits.  Tingling was isolated to the lateral aspect of the left lower leg.  Patient is most consistent with sciatica.  No neurologic deficits noted on exam ?DDX ?Peripheral sensory neuropathy -appears isolated to lle most- consider positional neuropathy due to position/pressure as present on awakening from sleep, no ho diabetes, or etoh to cause systemic etiologies although unlikely in isolated neuropathy. ?Consider spinal etiology- patient has been having ongoing back problems with sciatica to this leg.  Plain x-rays obtained and show mild degenerative disease at L1-L2 possible early nondisplaced L5 pars defect.  Doubt that there is a significant trauma as patient does not report any falls or injuries. ?Will refer to Ortho for follow-up.  Patient advised regarding routine precautions and need for follow-up and voices understanding ? ?Amount and/or Complexity of Data  Reviewed ?Labs: ordered. Decision-making details documented in ED Course. ?Radiology: ordered and independent interpretation performed. ? ? ? ? ? ? ? ? ? ? ?Final Clinical Impression(s) / ED Diagnoses ?Final diagnoses:  ?Paresthesia  ? ? ?Rx / DC Orders ?ED Discharge Orders   ? ? None  ? ?  ? ? ?  ?Margarita Grizzle, MD ?05/03/22 [phone] ? ?

## 2023-08-22 IMAGING — CR DG LUMBAR SPINE COMPLETE 4+V
4 series · 4 of 4 positions shown · non-contrast
Comparison: None Available.

CLINICAL DATA: Radiculopathy in left leg.

EXAM:
LUMBAR SPINE - COMPLETE 4+ VIEW

[l-spine ap]
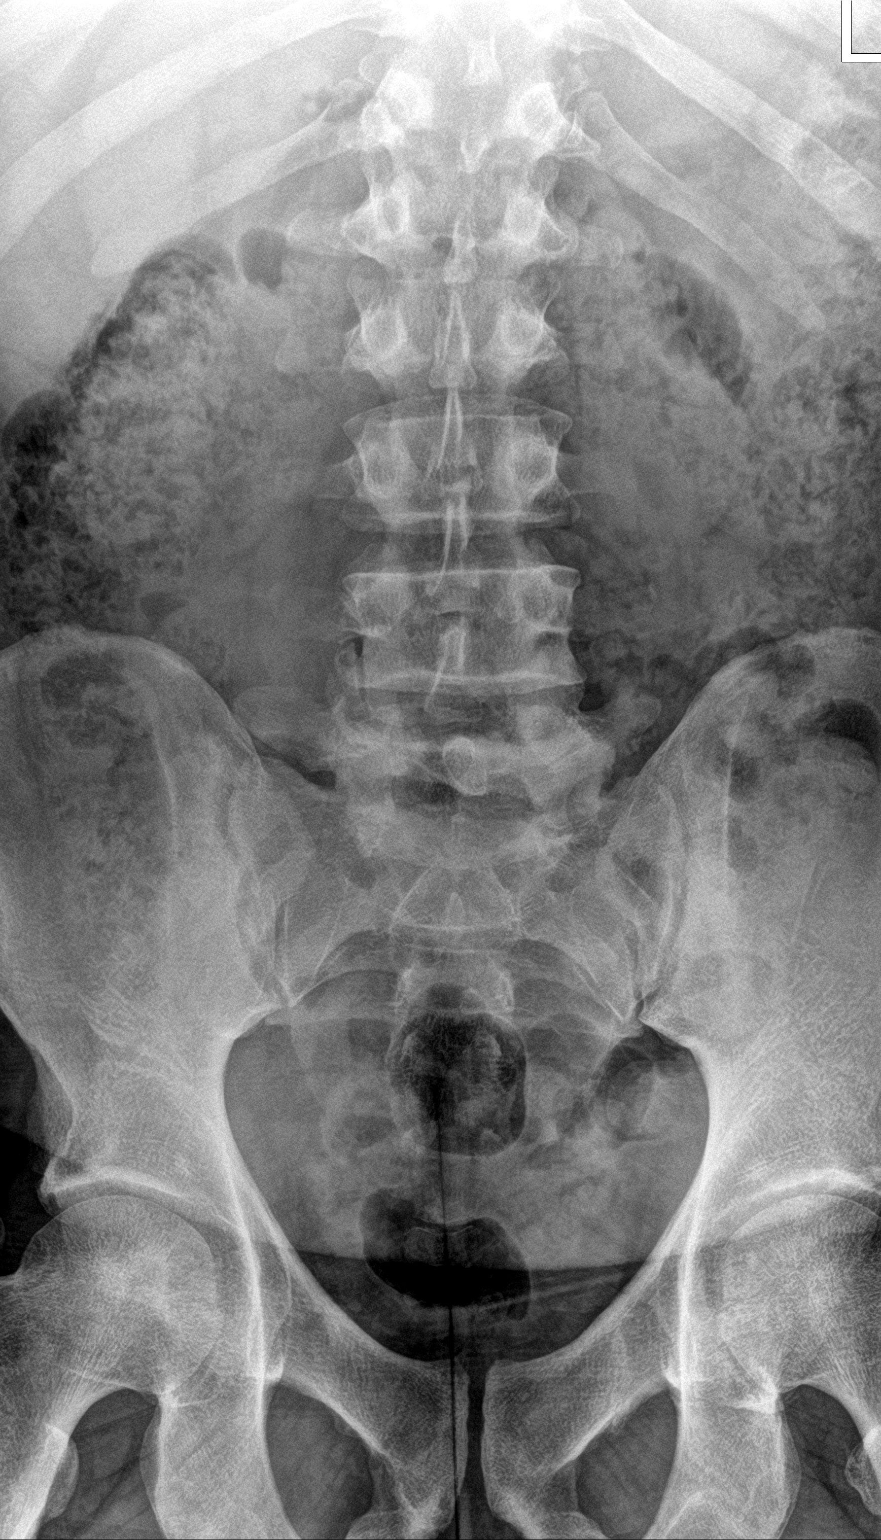

[l-spine obl (1 of 2)]
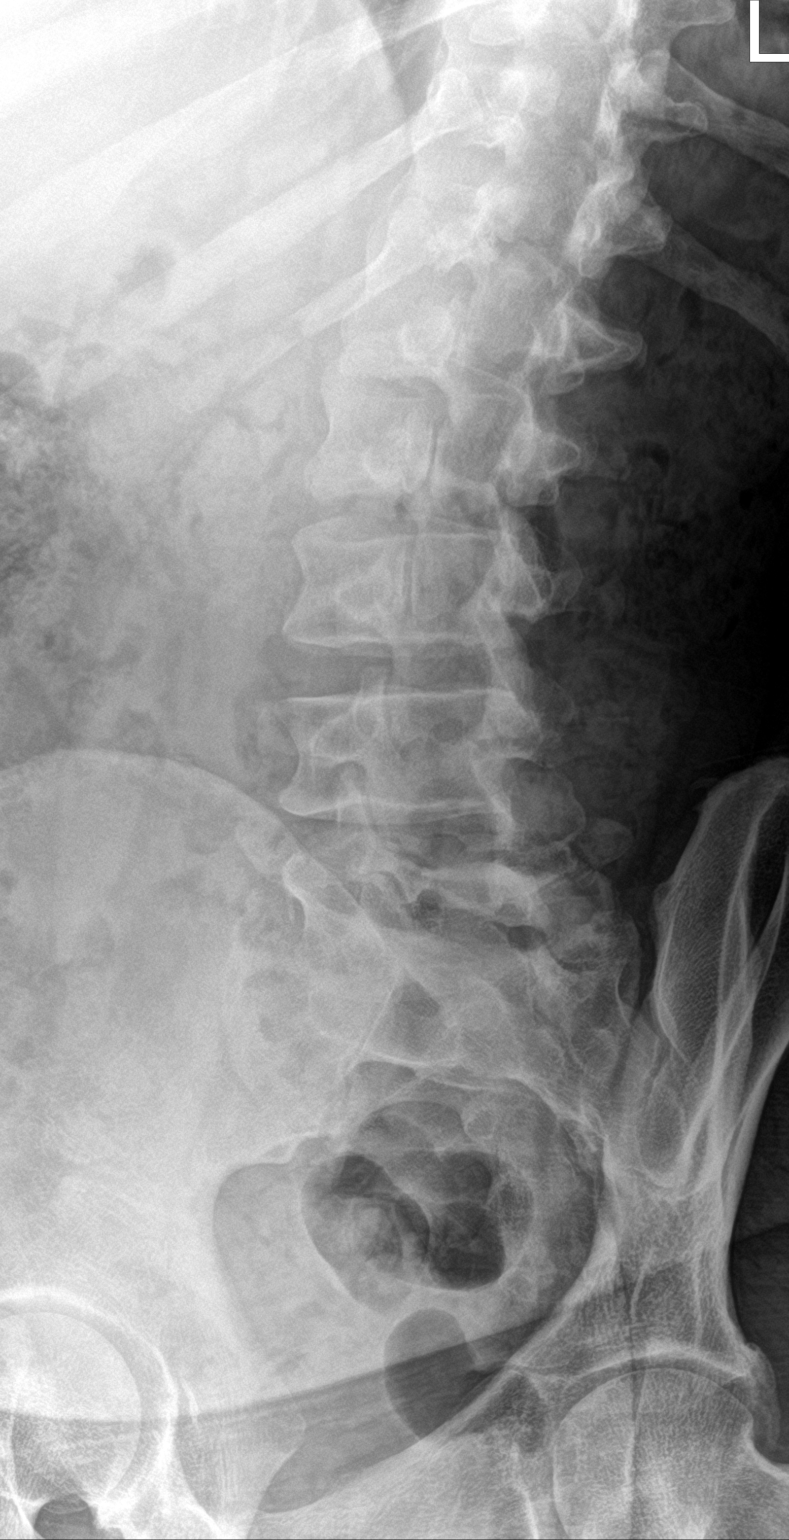

[l-spine obl (2 of 2)]
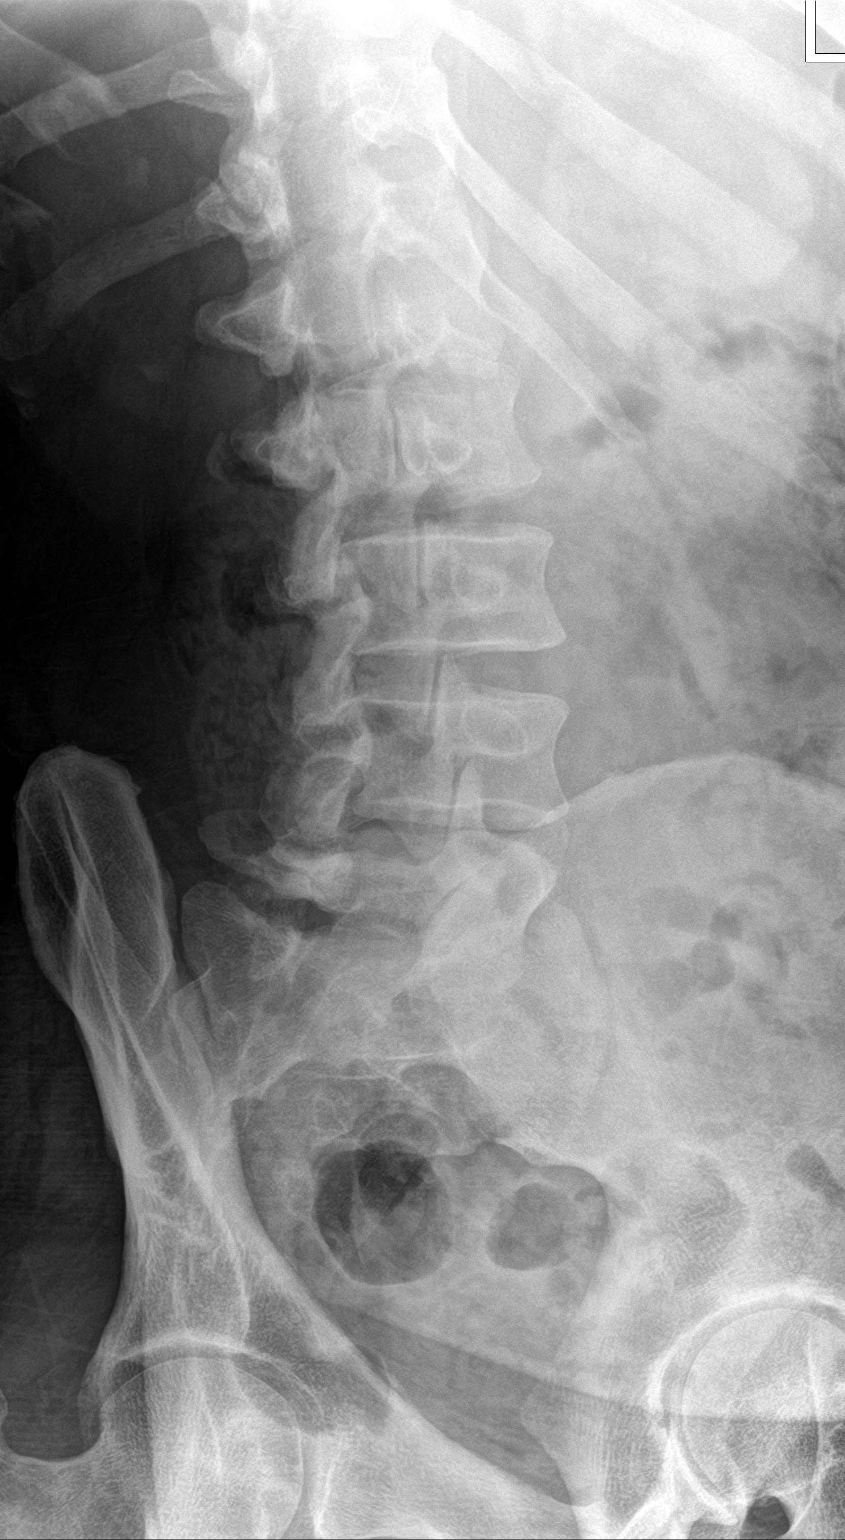

[l-spine lat]
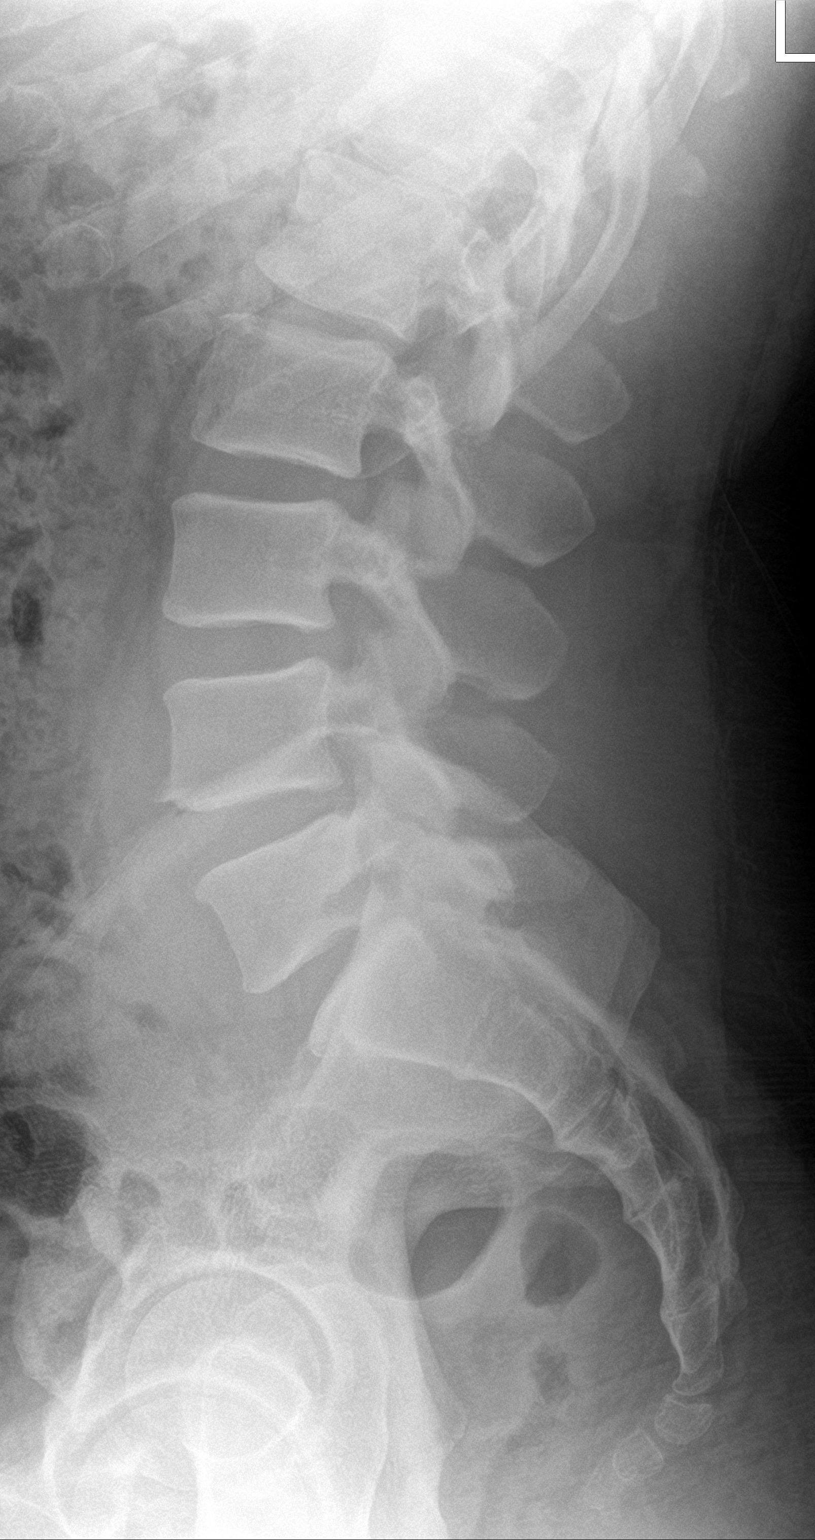

[4 of 4 positions shown; findings below may reference images not displayed]

FINDINGS: The alignment of the lumbar spine appears normal. The vertebral body
heights are well preserved. No signs of acute fracture or
dislocation. Possible early L5, nondisplaced left-sided pars defect.
No signs of anterolisthesis. Mild disc space narrowing and posterior
endplate spurring noted at L1-2.
IMPRESSION: 1. No acute findings.
2. Mild degenerative disc disease at L1-2.
3. Possible early, nondisplaced left L5 pars defect. No signs of
anterolisthesis.

## 2023-12-28 ENCOUNTER — Emergency Department (HOSPITAL_COMMUNITY): Payer: Commercial Managed Care - PPO

## 2023-12-28 ENCOUNTER — Emergency Department (HOSPITAL_COMMUNITY)
Admission: EM | Admit: 2023-12-28 | Discharge: 2023-12-28 | Disposition: A | Discharge status: Home / Self Care | Payer: Commercial Managed Care - PPO | Attending: Emergency Medicine | Admitting: Emergency Medicine

## 2023-12-28 DIAGNOSIS — M5442 Lumbago with sciatica, left side: Secondary | ICD-10-CM | POA: Insufficient documentation

## 2023-12-28 DIAGNOSIS — M545 Low back pain, unspecified: Secondary | ICD-10-CM | POA: Diagnosis present

## 2023-12-28 DIAGNOSIS — R202 Paresthesia of skin: Secondary | ICD-10-CM | POA: Insufficient documentation

## 2023-12-28 DIAGNOSIS — S39012A Strain of muscle, fascia and tendon of lower back, initial encounter: Secondary | ICD-10-CM

## 2023-12-28 DIAGNOSIS — I10 Essential (primary) hypertension: Secondary | ICD-10-CM

## 2023-12-28 LAB — CBC
HCT: 44 % (ref 39.0–52.0)
Hemoglobin: 14.6 g/dL (ref 13.0–17.0)
MCH: 27.3 pg (ref 26.0–34.0)
MCHC: 33.2 g/dL (ref 30.0–36.0)
MCV: 82.2 fL (ref 80.0–100.0)
Platelets: 273 10*3/uL (ref 150–400)
RBC: 5.35 MIL/uL (ref 4.22–5.81)
RDW: 13.9 % (ref 11.5–15.5)
WBC: 6.7 10*3/uL (ref 4.0–10.5)
nRBC: 0 % (ref 0.0–0.2)

## 2023-12-28 LAB — BASIC METABOLIC PANEL
Anion gap: 12 (ref 5–15)
BUN: 19 mg/dL (ref 6–20)
CO2: 20 mmol/L — ABNORMAL LOW (ref 22–32)
Calcium: 9.2 mg/dL (ref 8.9–10.3)
Chloride: 105 mmol/L (ref 98–111)
Creatinine, Ser: 0.95 mg/dL (ref 0.61–1.24)
GFR, Estimated: >60 mL/min (ref >60)
Glucose, Bld: 124 mg/dL — ABNORMAL HIGH (ref 70–99)
Potassium: 4 mmol/L (ref 3.5–5.1)
Sodium: 137 mmol/L (ref 135–145)

## 2023-12-28 LAB — TROPONIN I (HIGH SENSITIVITY): Troponin I (High Sensitivity): 3 ng/L (ref <18)

## 2023-12-28 MED ORDER — METHOCARBAMOL 500 MG PO TABS: 500.0000 mg | ORAL_TABLET | Freq: Two times a day (BID) | ORAL | 0 refills | Status: DC

## 2023-12-28 MED ORDER — AMLODIPINE BESYLATE 5 MG PO TABS: 5.0000 mg | ORAL_TABLET | Freq: Every day | ORAL | 0 refills | Status: DC

## 2023-12-28 MED ORDER — OXYCODONE-ACETAMINOPHEN 5-325 MG PO TABS
1.0000 | ORAL_TABLET | Freq: Once | ORAL | Status: AC
  Administered 2023-12-28: 1 via ORAL
  Filled 2023-12-28: qty 1

## 2023-12-28 NOTE — ED Provider Triage Note (Signed)
 Emergency Medicine Provider Triage Evaluation Note  Joshua Lam , a 48 y.o. male  was evaluated in triage.  Pt complains of tingling in bilateral arms, legs, mild tingling in left face briefly on waking from sleep at 4 AM.  He denies any weakness, vision changes.  Arrives to the ED hypertensive, denies any history of hypertension.  Reports he has been having some right lower back pain for which she is seeing the chiropractor. Denies chest pain, shob.  Review of Systems  Positive: tingling Negative: Chest pain, shob  Physical Exam  BP (!) 196/110 (BP Location: Right Arm)   Pulse 70   Temp 98.2 F (36.8 C) (Oral)   Resp 18   Ht 5' 11 (1.803 m)   Wt 115.7 kg   SpO2 96%   BMI 35.57 kg/m  Gen:   Awake, no distress   Resp:  Normal effort  MSK:   Moves extremities without difficulty  Other:  Cranial nerves II through XII grossly intact.  Intact finger-nose, intact heel-to-shin.  Romberg negative, gait normal.  Alert and oriented x3.  Moves all 4 limbs spontaneously, normal coordination.  No pronator drift.  Intact strength 5 out of 5 bilateral upper and lower extremities.  Medical Decision Making  Medically screening exam initiated at 7:05 AM.  Appropriate orders placed.  Joshua Lam was informed that the remainder of the evaluation will be completed by another provider, this initial triage assessment does not replace that evaluation, and the importance of remaining in the ED until their evaluation is complete.  Workup initiated in triage    Joshua Lam, [place] 12/28/23 [id number]

## 2023-12-28 NOTE — ED Triage Notes (Signed)
 Patient arrived stating when he tried to go to sleep tonight he began having tingling in bilateral arms and legs, reports some left sided facial numbness. Extremities equal in strength and sensation

## 2023-12-28 NOTE — ED Notes (Signed)
 Gave pt peanut butter and graham crackers, water and orange juice

## 2023-12-28 NOTE — Discharge Instructions (Addendum)
 Thank you for the opportunity to take care of you in our Emergency Department. You have been diagnosed with high blood pressure, also known as hypertension. This means that the force of blood against the walls of your blood vessels called is too strong. It also means that your heart has to work harder to move the blood. High blood pressure usually has no symptoms, but over time, it can cause serious health problems such as Heart attack and heart failure Stroke Kidney disease and failure Vision loss With the help from your healthcare provider and some important life style changes, you can manage your blood pressure and protect your health. Please read the instructions provided on hypertension, how to manage it and how to check your blood pressure. Additionally, use the blood pressure log provided to record your blood pressures. Take the blood pressure log with you to your primary care doctor so that they can adjust your blood pressure medications if needed. Please read the instructions on follow-up appointment. Return to the ER or Call 911 right away if you have any of these symptoms: Chest pain or shortness of breath Severe headache Weakness, tingling, or numbness of your face, arms, or legs (especially on 1 side of the body) Sudden change in vision Confusion, trouble speaking, or trouble understanding speech  --  In regards to the back pain,  Please use Tylenol  or ibuprofen  for pain.  You may use 600 mg ibuprofen  every 6 hours or 1000 mg of Tylenol  every 6 hours.  You may choose to alternate between the 2.  This would be most effective.  Not to exceed 4 g of Tylenol  within 24 hours.  Not to exceed 3200 mg ibuprofen  24 hours.  You can use the muscle relaxant I am prescribing in addition to the above to help with any breakthrough pain.  You can take it up to twice daily.  It is safe to take at night, but I would be cautious taking it during the day as it can cause some drowsiness.  Make sure  that you are feeling awake and alert before you get behind the wheel of a car or operate a motor vehicle.  It is not a narcotic pain medication so you are able to take it if it is not making you drowsy and still pilot a vehicle or machinery safely.

## 2023-12-28 NOTE — ED Provider Notes (Signed)
  EMERGENCY DEPARTMENT AT [facility] Provider Note   CSN: [id number] Arrival date & time: 12/28/23  0446     History  Chief Complaint  Patient presents with   Numbness   Tingling    Joshua Lam is a 48 y.o. male Pt complains of tingling in bilateral arms, legs, mild tingling in left face briefly on waking from sleep at 4 AM.  He denies any weakness, vision changes.  Arrives to the ED hypertensive, denies any history of hypertension.  Reports he has been having some right lower back pain for which she is seeing the chiropractor. Denies chest pain, shob.   HPI     Home Medications Prior to Admission medications   Medication Sig Start Date End Date Taking? Authorizing Provider  fluticasone  (FLONASE ) 50 MCG/ACT nasal spray Place 2 sprays into both nostrils daily. 06/13/15   Jarold Olam HERO, PA-C  sodium chloride (OCEAN) 0.65 % SOLN nasal spray Place 1 spray into both nostrils at bedtime.    [provider]      Allergies    Patient has no known allergies.    Review of Systems   Review of Systems  All other systems reviewed and are negative.   Physical Exam Updated Vital Signs BP (!) 149/109 (BP Location: Left Arm)   Pulse 65   Temp 98.2 F (36.8 C) (Oral)   Resp 15   Ht 5' 11 (1.803 m)   Wt 115.7 kg   SpO2 97%   BMI 35.57 kg/m  Physical Exam Vitals and nursing note reviewed.  Constitutional:      General: He is not in acute distress.    Appearance: Normal appearance.  HENT:     Head: Normocephalic and atraumatic.  Eyes:     General:        Right eye: No discharge.        Left eye: No discharge.  Cardiovascular:     Rate and Rhythm: Normal rate and regular rhythm.     Heart sounds: No murmur heard.    No friction rub. No gallop.  Pulmonary:     Effort: Pulmonary effort is normal.     Breath sounds: Normal breath sounds.  Abdominal:     General: Bowel sounds are normal.     Palpations: Abdomen is soft.  Musculoskeletal:      Comments: Intact strength to bilateral lower extremities, he has some tenderness to palpation of the right lumbar paraspinous muscles.  No midline spinal tenderness, no step-off, deformity.  Skin:    General: Skin is warm and dry.     Capillary Refill: Capillary refill takes less than 2 seconds.  Neurological:     Mental Status: He is alert and oriented to person, place, and time.     Comments: Cranial nerves II through XII grossly intact.  Intact finger-nose, intact heel-to-shin.  Romberg negative, gait normal.  Alert and oriented x3.  Moves all 4 limbs spontaneously, normal coordination.  No pronator drift.  Intact strength 5 out of 5 bilateral upper and lower extremities.     Psychiatric:        Mood and Affect: Mood normal.        Behavior: Behavior normal.     ED Results / Procedures / Treatments   Labs (all labs ordered are listed, but only abnormal results are displayed) Labs Reviewed  BASIC METABOLIC PANEL - Abnormal; Notable for the following components:      Result Value   CO2  20 (*)    Glucose, Bld 124 (*)    All other components within normal limits  CBC  TROPONIN I (HIGH SENSITIVITY)  TROPONIN I (HIGH SENSITIVITY)    EKG EKG Interpretation Date/Time:  Friday December 28 2023 07:26:35 EST Ventricular Rate:  66 PR Interval:  149 QRS Duration:  89 QT Interval:  387 QTC Calculation: 406 R Axis:   33  Text Interpretation: Sinus rhythm Borderline T abnormalities, inferior leads ST elev, probable normal early repol pattern No significant change since last tracing Confirmed by Doretha Folks ([id number]) on 12/28/2023 7:37:25 AM  Radiology CT Head Wo Contrast Result Date: 12/28/2023 CLINICAL DATA:  Tingling in the bilateral arms and legs. EXAM: CT HEAD WITHOUT CONTRAST TECHNIQUE: Contiguous axial images were obtained from the base of the skull through the vertex without intravenous contrast. RADIATION DOSE REDUCTION: This exam was performed according to the departmental  dose-optimization program which includes automated exposure control, adjustment of the mA and/or kV according to patient size and/or use of iterative reconstruction technique. COMPARISON:  None Available. FINDINGS: Brain: No evidence of acute infarction, hemorrhage, hydrocephalus, extra-axial collection or mass lesion/mass effect. Vascular: No hyperdense vessel or unexpected calcification. Skull: Normal. Negative for fracture or focal lesion. Sinuses/Orbits: No acute finding. IMPRESSION: Normal head CT. Electronically Signed   By: Dorn Roulette M.D.   On: 12/28/2023 07:28   DG Chest Portable 1 View Result Date: 12/28/2023 CLINICAL DATA:  48 year old male with history of chest pain. EXAM: PORTABLE CHEST 1 VIEW COMPARISON:  Chest x-ray 12/04/2020. FINDINGS: Lung volumes are normal. No consolidative airspace disease. No pleural effusions. No pneumothorax. No pulmonary nodule or mass noted. Pulmonary vasculature and the cardiomediastinal silhouette are within normal limits. IMPRESSION: No radiographic evidence of acute cardiopulmonary disease. Electronically Signed   By: Toribio Aye M.D.   On: 12/28/2023 07:26    Procedures Procedures    Medications Ordered in ED Medications  oxyCODONE -acetaminophen  (PERCOCET/ROXICET) 5-325 MG per tablet 1 tablet (1 tablet Oral Given 12/28/23 9272)    ED Course/ Medical Decision Making/ A&P                                 Medical Decision Making Amount and/or Complexity of Data Reviewed Labs: ordered. Radiology: ordered.  Risk Prescription drug management.   This patient is a 49 y.o. male  who presents to the ED for concern of multiple complaints today, numbness, tingling of bilateral arms and legs briefly, brief left-sided facial tingling, right lower back pain, shortness of breath, hypertension.    Differential diagnoses prior to evaluation: The emergent differential diagnosis includes, but is not limited to, hypertensive urgency, emergency, lumbar  strain, radiculopathy, atypical ACS, primary respiratory abnormality including asthma, COPD, CHF, stroke, MS, ALS, versus other. This is not an exhaustive differential.   Past Medical History / Co-morbidities / Social History: No previous history of ACS, does not have a primary care doctor, no previous diagnosis of hypertension, diabetes, high cholesterol, versus other..   Physical Exam: Physical exam performed. The pertinent findings include:  Intact strength to bilateral lower extremities, he has some tenderness to palpation of the right lumbar paraspinous muscles.  No midline spinal tenderness, no step-off, deformity.   Cranial nerves II through XII grossly intact.  Intact finger-nose, intact heel-to-shin.  Romberg negative, gait normal.  Alert and oriented x3.  Moves all 4 limbs spontaneously, normal coordination.  No pronator drift.  Intact strength 5 out of  5 bilateral upper and lower extremities.  Lab Tests/Imaging studies: I personally interpreted labs/imaging and the pertinent results include: CBC unremarkable, troponin negative x 1 in context of no chest pain, otherwise asymptomatic hypertension.  BMP notable for mild bicarb deficit, CO2 20, otherwise unremarkable.  Independently interpreted plain film chest x-ray as well as CT of the head which showed no evidence of acute intrathoracic or intracranial abnormality.. I agree with the radiologist interpretation.  Cardiac monitoring: EKG obtained and interpreted by myself and attending physician which shows: Normal sinus rhythm, early repull pattern, no acute ST-T changes from baseline   Medications: I ordered medication including that for back pain, will discharge with new blood pressure medication, amlodipine , PCP follow-up, Robaxin  for back pain, rehab exercises, and orthopedic follow-up as needed.  I have reviewed the patients home medicines and have made adjustments as needed.   Disposition: After consideration of the diagnostic  results and the patients response to treatment, I feel that patient is stable for discharge with plan as above .   emergency department workup does not suggest an emergent condition requiring admission or immediate intervention beyond what has been performed at this time. The plan is: as above. The patient is safe for discharge and has been instructed to return immediately for worsening symptoms, change in symptoms or any other concerns.  Final Clinical Impression(s) / ED Diagnoses Final diagnoses:  None    Rx / DC Orders ED Discharge Orders     None         Rosan Sherlean VEAR DEVONNA 12/28/23 1120    Doretha Folks, MD 12/28/23 1455

## 2024-01-01 ENCOUNTER — Telehealth: Payer: Self-pay

## 2024-01-01 NOTE — Progress Notes (Signed)
   01/01/2024  Patient ID: Joshua Lam, male   DOB: [birth year], 48 y.o.   MRN: [id number]  Attempted to contact patient to follow up on recent ED visit for HTN. Left HIPAA compliant message for patient to return my call at their convenience.   Jon VEAR Lindau, PharmD Clinical Pharmacist [phone]

## 2024-01-03 ENCOUNTER — Ambulatory Visit (INDEPENDENT_AMBULATORY_CARE_PROVIDER_SITE_OTHER): Payer: Commercial Managed Care - PPO | Admitting: Family Medicine

## 2024-01-03 ENCOUNTER — Encounter: Payer: Self-pay | Admitting: Family Medicine

## 2024-01-03 VITALS — BP 130/98 | HR 71 | Ht 71.0 in | Wt 260.6 lb

## 2024-01-03 DIAGNOSIS — M4727 Other spondylosis with radiculopathy, lumbosacral region: Secondary | ICD-10-CM

## 2024-01-03 DIAGNOSIS — M160 Bilateral primary osteoarthritis of hip: Secondary | ICD-10-CM

## 2024-01-03 NOTE — Telephone Encounter (Signed)
 Reached patient. He was unavailable at this time but states he will try to call me back later today.

## 2024-01-05 ENCOUNTER — Encounter: Payer: Self-pay | Admitting: Family Medicine

## 2024-01-05 DIAGNOSIS — M4727 Other spondylosis with radiculopathy, lumbosacral region: Secondary | ICD-10-CM | POA: Insufficient documentation

## 2024-01-05 DIAGNOSIS — M160 Bilateral primary osteoarthritis of hip: Secondary | ICD-10-CM | POA: Insufficient documentation

## 2024-01-05 MED ORDER — METHOCARBAMOL 500 MG PO TABS: 500.0000 mg | ORAL_TABLET | Freq: Two times a day (BID) | ORAL | 0 refills | Status: DC | PRN

## 2024-01-05 MED ORDER — GABAPENTIN 300 MG PO CAPS: 300.0000 mg | ORAL_CAPSULE | Freq: Every day | ORAL | 0 refills | Status: DC

## 2024-01-05 MED ORDER — MELOXICAM 15 MG PO TABS: 15.0000 mg | ORAL_TABLET | Freq: Every day | ORAL | 0 refills | Status: DC | PRN

## 2024-01-05 NOTE — Assessment & Plan Note (Signed)
 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 He reports that these issues have been exacerbated by his active lifestyle, which involves frequent movement and physical exertion. The patient has been seeking chiropractic treatment for these issues, but he has not seen significant improvement. The patient also reports having sciatica and has been using orthotics and a compression sleeve to manage his symptoms. He has been experiencing tingling in his legs, which he describes as a new symptom. The patient is currently taking Robaxin  (methocarbamol ).  Results RADIOLOGY Lumbar spine X-ray: Normal alignment, no scoliosis, vertebral levels are intact. Moderate osteoarthritis in the left hip, mild to moderate osteoarthritis in the right hip. Anterior endplate osteophyte at L4, pronounced lordosis, mild osteoarthritis at L4-L5 and L5-S1 (04/2022).  Lower Back Pain with radiculopathy Acute on chronic Severe pain with radiating symptoms to the legs. X-ray showed mild to moderate arthritis in the hips and lumbar spine with pronounced lordosis. Positive fader test on the right hip. No severe disc or nerve involvement noted on x-ray. -Continue Robaxin  (methocarbamol ) as needed for muscle relaxation. -Start Gabapentin  as needed for nerve pain. -Start Meloxicam  for inflammation, to be taken for a minimum of one week during flare-ups or as needed before strenuous activity. -Initiate physical therapy to address lower cross syndrome and improve core and glute strength. -Check in 6-8 weeks to assess improvement. If no significant improvement, consider MRI to evaluate for disc or nerve involvement.

## 2024-01-05 NOTE — Progress Notes (Signed)
 Primary Care / Sports Medicine Office Visit  Patient Information:  Patient ID: Joshua Lam, male DOB: Jul 24, 1976 Age: 48 y.o. MRN: [id number]   Joshua Lam is a pleasant 48 y.o. male presenting with the following:  Chief Complaint  Patient presents with   Back Pain    Low back pain x 1 yea. Aggravating factors sitting on hard surfaces, and standing to long. Alleviating factors stretching. NO PT or injection therapy for his back. NKI.     Vitals:   01/03/24 1016  BP: (!) 130/98  Pulse: 71  SpO2: 97%   Vitals:   01/03/24 1016  Weight: 260 lb 9.6 oz (118.2 kg)  Height: 5' 11 (1.803 m)   Body mass index is 36.35 kg/m.     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 He reports that these issues have been exacerbated by his active lifestyle, which involves frequent movement and physical exertion. The patient has been seeking chiropractic treatment for these issues, but he has not seen significant improvement. The patient also reports having sciatica and has been using orthotics and a compression sleeve to manage his symptoms. He has been experiencing tingling in his legs, which he describes as a new symptom. The patient is currently taking Robaxin  (methocarbamol ).  Results RADIOLOGY Lumbar spine X-ray: Normal alignment, no scoliosis, vertebral levels are intact. Moderate osteoarthritis in the left hip, mild to moderate osteoarthritis in the right hip. Anterior  endplate osteophyte at L4, pronounced lordosis, mild osteoarthritis at L4-L5 and L5-S1 (04/2022).  Lower Back Pain with radiculopathy Acute on chronic Severe pain with radiating symptoms to the legs. X-ray showed mild to moderate arthritis in the hips and lumbar spine with pronounced lordosis. Positive fader test on the right hip. No severe disc or nerve involvement noted on x-ray. -Continue Robaxin  (methocarbamol ) as needed for muscle relaxation. -Start Gabapentin  as needed for nerve pain. -Start Meloxicam  for inflammation, to be taken for a minimum of one week during flare-ups or as needed before strenuous activity. -Initiate physical therapy to address lower cross syndrome and improve core and glute strength. -Check in 6-8 weeks to assess improvement. If no significant improvement, consider MRI to evaluate for disc or nerve involvement.      Relevant Medications   gabapentin  (NEURONTIN ) 300 MG capsule   meloxicam  (MOBIC ) 15 MG tablet   methocarbamol  (ROBAXIN ) 500 MG tablet     Orders & Medications Medications:  Meds ordered this encounter  Medications   gabapentin  (NEURONTIN ) 300 MG capsule    Sig: Take 1 capsule (300 mg total) by mouth at bedtime.    Dispense:  30 capsule    Refill:  0   meloxicam  (MOBIC ) 15 MG tablet    Sig: Take 1 tablet (15 mg total) by mouth daily as needed for pain.    Dispense:  30 tablet    Refill:  0   methocarbamol  (ROBAXIN ) 500 MG tablet    Sig: Take 1 tablet (500 mg total) by mouth 2 (two) times daily  as needed for muscle spasms.    Dispense:  60 tablet    Refill:  0   No orders of the defined types were placed in this encounter.    Return for CPE, 40 min new primary care.     Selinda JINNY Ku, MD, [facility]   Primary Care Sports Medicine Primary Care and Sports Medicine at [facility]

## 2024-01-05 NOTE — Patient Instructions (Signed)
  Plan for Lower Back Pain:  - Continue current muscle relaxer (Robaxin ). - Add Gabapentin  for nerve pain management. - Add Meloxicam  for inflammation control. - Begin physical therapy focused on strengthening core and glute muscles. - Reassess condition in 6-8 weeks. If no significant improvement, consider an MRI to evaluate potential disc or nerve involvement.  Plan for Hip Arthritis:  - Continue current management as symptoms are not severe. - Monitor condition for any changes or worsening of symptoms.

## 2024-01-07 ENCOUNTER — Telehealth: Payer: Self-pay

## 2024-01-07 NOTE — Telephone Encounter (Signed)
 Copied from CRM [id number]. Topic: General - Inquiry >> Jan 04, 2024  3:50 PM Yolanda T wrote: Reason for CRM: patient requesting call back as he thought provider was sending in medication after his visit on yesterday

## 2024-01-07 NOTE — Telephone Encounter (Signed)
 Called pt left VM to call back. All 3 medications was sent in.   KP

## 2024-02-11 ENCOUNTER — Ambulatory Visit: Payer: Commercial Managed Care - PPO | Admitting: Physician Assistant

## 2024-02-11 ENCOUNTER — Ambulatory Visit (INDEPENDENT_AMBULATORY_CARE_PROVIDER_SITE_OTHER): Payer: Commercial Managed Care - PPO | Admitting: Family Medicine

## 2024-02-11 ENCOUNTER — Encounter: Payer: Self-pay | Admitting: Family Medicine

## 2024-02-11 VITALS — BP 122/84 | HR 69 | Ht 71.0 in | Wt 262.0 lb

## 2024-02-11 DIAGNOSIS — R5383 Other fatigue: Secondary | ICD-10-CM | POA: Diagnosis not present

## 2024-02-11 DIAGNOSIS — Z136 Encounter for screening for cardiovascular disorders: Secondary | ICD-10-CM

## 2024-02-11 DIAGNOSIS — E559 Vitamin D deficiency, unspecified: Secondary | ICD-10-CM | POA: Diagnosis not present

## 2024-02-11 DIAGNOSIS — Z0000 Encounter for general adult medical examination without abnormal findings: Secondary | ICD-10-CM | POA: Diagnosis not present

## 2024-02-11 DIAGNOSIS — M4727 Other spondylosis with radiculopathy, lumbosacral region: Secondary | ICD-10-CM

## 2024-02-11 DIAGNOSIS — R7309 Other abnormal glucose: Secondary | ICD-10-CM

## 2024-02-11 DIAGNOSIS — N400 Enlarged prostate without lower urinary tract symptoms: Secondary | ICD-10-CM

## 2024-02-11 DIAGNOSIS — Z1211 Encounter for screening for malignant neoplasm of colon: Secondary | ICD-10-CM

## 2024-02-11 DIAGNOSIS — Z1159 Encounter for screening for other viral diseases: Secondary | ICD-10-CM

## 2024-02-11 DIAGNOSIS — I10 Essential (primary) hypertension: Secondary | ICD-10-CM

## 2024-02-11 MED ORDER — MELOXICAM 15 MG PO TABS: 15.0000 mg | ORAL_TABLET | Freq: Every day | ORAL | 2 refills | Status: DC | PRN

## 2024-02-11 MED ORDER — METHOCARBAMOL 500 MG PO TABS: 500.0000 mg | ORAL_TABLET | Freq: Two times a day (BID) | ORAL | 2 refills | Status: DC | PRN

## 2024-02-11 NOTE — Assessment & Plan Note (Signed)
 He experiences chronic back pain and radiculopathy with intermittent nerve pinching due to wear and tear, as noted in previous x-rays. He describes episodes where his back 'locked up real bad,' particularly after removing a knee sleeve awkwardly, which left him unable to move for several days. Chiropractic traction provided temporary relief, but his back remained tight. He has tightness in the lower back radiating to the tops of the knees, without numbness or tingling in the legs.  His medication regimen includes meloxicam taken daily, methocarbamol as needed, and gabapentin at night depending on pain severity. Initially, these medications provided significant relief, but he has since experienced persistent lower back tightness. He notes that he is running low on methocarbamol and meloxicam.  In terms of lifestyle, he has been engaging in more stretching and functional training at the gym, having stopped heavy weightlifting. He is exploring exercises to address tight hips and abdominal strength.  Lumbar Radiculopathy Patient reports lower back tightness radiating down to the knees without numbness or tingling. Current medications include Meloxicam, Methocarbamol, and Gabapentin. -Continue Meloxicam daily as needed. -Continue Methocarbamol and Gabapentin as needed. -Start home-based rehab. -Consider physical therapy for targeted strengthening and stretching exercises. -Consider MRI if symptoms persist or worsen to evaluate for possible intervention by spine group.

## 2024-02-11 NOTE — Assessment & Plan Note (Signed)
 Annual examination completed, risk stratification labs ordered, anticipatory guidance provided.  We will follow labs once resulted.

## 2024-02-11 NOTE — Progress Notes (Signed)
 Annual Physical Exam Visit  Patient Information:  Patient ID: Joshua Lam, male DOB: Aug 17, 1976 Age: 48 y.o. MRN: [id number]   Subjective:   CC: Annual Physical Exam  HPI:  Joshua Lam is here for their annual physical.  I reviewed the past medical history, family history, social history, surgical history, and allergies today and changes were made as necessary.  Please see the problem list section below for additional details.  Past Medical History: Past Medical History:  Diagnosis Date   Hypertension    Past Surgical History: Past Surgical History:  Procedure Laterality Date   UMBILICAL HERNIA REPAIR  1980   Family History: Family History  Problem Relation Age of Onset   Hypertension Mother    Diabetes Mother    Hypertension Father    CAD Father    Heart attack Father        Passed at 66   Allergies: No Known Allergies Health Maintenance: Health Maintenance  Topic Date Due   HIV Screening  Never done   Hepatitis C Screening  Never done   DTaP/Tdap/Td (1 - Tdap) Never done   Colonoscopy  Never done   COVID-19 Vaccine (1 - 2024-25 season) Never done   INFLUENZA VACCINE  03/24/2024 (Originally 07/26/2023)   HPV VACCINES  Aged Out    HM Colonoscopy          Current Care Gaps     Colonoscopy (Every 10 Years) Never done   No completion history exists for this topic.                Medications: Current Outpatient Medications on File Prior to Visit  Medication Sig Dispense Refill   arginine 500 MG tablet Take 500 mg by mouth daily. Zinc-citraline- vit packet     fluticasone (FLONASE) 50 MCG/ACT nasal spray Place 2 sprays into both nostrils daily. 16 g 2   gabapentin (NEURONTIN) 300 MG capsule Take 1 capsule (300 mg total) by mouth at bedtime. 30 capsule 0   VITAMIN D PO Take by mouth.     No current facility-administered medications on file prior to visit.    Objective:   Vitals:   02/11/24 1056  BP: 122/84  Pulse: 69  SpO2: 97%    Vitals:   02/11/24 1056  Weight: 262 lb (118.8 kg)  Height: 5\' 11"  (1.803 m)   Body mass index is 36.54 kg/m.  General: Well Developed, well nourished, and in no acute distress.  Neuro: Alert and oriented x3, extra-ocular muscles intact, sensation grossly intact. Cranial nerves II through XII are grossly intact, motor, sensory, and coordinative functions are intact. HEENT: Normocephalic, atraumatic, neck supple, no masses, no lymphadenopathy, thyroid nonenlarged. Oropharynx, nasopharynx, external ear canals are unremarkable. Skin: Warm and dry, no rashes noted.  Cardiac: Regular rate and rhythm, no murmurs rubs or gallops. No peripheral edema. Pulses symmetric. Respiratory: Clear to auscultation bilaterally. Speaking in full sentences.  Abdominal: Soft, nontender, nondistended, positive bowel sounds, no masses, no organomegaly. Musculoskeletal: Stable, and with full range of motion.   Impression and Recommendations:   The patient was counselled, risk factors were discussed, and anticipatory guidance given.  Problem List Items Addressed This Visit     Healthcare maintenance - Primary   Annual examination completed, risk stratification labs ordered, anticipatory guidance provided.  We will follow labs once resulted.      Relevant Orders   CBC   Lumbosacral spondylosis with radiculopathy   He experiences chronic back pain and radiculopathy with  intermittent nerve pinching due to wear and tear, as noted in previous x-rays. He describes episodes where his back 'locked up real bad,' particularly after removing a knee sleeve awkwardly, which left him unable to move for several days. Chiropractic traction provided temporary relief, but his back remained tight. He has tightness in the lower back radiating to the tops of the knees, without numbness or tingling in the legs.  His medication regimen includes meloxicam taken daily, methocarbamol as needed, and gabapentin at night depending on  pain severity. Initially, these medications provided significant relief, but he has since experienced persistent lower back tightness. He notes that he is running low on methocarbamol and meloxicam.  In terms of lifestyle, he has been engaging in more stretching and functional training at the gym, having stopped heavy weightlifting. He is exploring exercises to address tight hips and abdominal strength.  Lumbar Radiculopathy Patient reports lower back tightness radiating down to the knees without numbness or tingling. Current medications include Meloxicam, Methocarbamol, and Gabapentin. -Continue Meloxicam daily as needed. -Continue Methocarbamol and Gabapentin as needed. -Start home-based rehab. -Consider physical therapy for targeted strengthening and stretching exercises. -Consider MRI if symptoms persist or worsen to evaluate for possible intervention by spine group.      Relevant Medications   methocarbamol (ROBAXIN) 500 MG tablet   meloxicam (MOBIC) 15 MG tablet   Primary hypertension   He has a history of high blood pressure, which he has been managing through lifestyle changes, including increased water intake and reduced salt consumption. He has not been on blood pressure medication for two weeks and reports improved readings.  Hypertension Blood pressure well controlled off medication for the past two weeks with lifestyle modifications. -Discontinue Amlodipine. -Monitor blood pressure at home and report any symptoms of hypertension such as headaches, blurred vision, etc. -Check blood pressure at follow-up in six months.      Other Visit Diagnoses       Other fatigue       Relevant Orders   CBC   TSH   VITAMIN D 25 Hydroxy (Vit-D Deficiency, Fractures)     Screening for cardiovascular condition       Relevant Orders   Comprehensive metabolic panel   Lipid panel     Vitamin D deficiency       Relevant Orders   VITAMIN D 25 Hydroxy (Vit-D Deficiency, Fractures)      Abnormal glucose       Relevant Orders   Hemoglobin A1c     Screening for viral disease       Relevant Orders   Hepatitis C antibody   HIV Antibody (routine testing w rflx)     Benign prostatic hyperplasia, unspecified whether lower urinary tract symptoms present       Relevant Orders   PSA Total (Reflex To Free)     Colon cancer screening       Relevant Orders   Ambulatory referral to Gastroenterology        Orders & Medications Medications:  Meds ordered this encounter  Medications   methocarbamol (ROBAXIN) 500 MG tablet    Sig: Take 1 tablet (500 mg total) by mouth 2 (two) times daily as needed for muscle spasms.    Dispense:  60 tablet    Refill:  2   meloxicam (MOBIC) 15 MG tablet    Sig: Take 1 tablet (15 mg total) by mouth daily as needed for pain.    Dispense:  30 tablet    Refill:  2   Orders Placed This Encounter  Procedures   CBC   Comprehensive metabolic panel   Hemoglobin A1c   Hepatitis C antibody   HIV Antibody (routine testing w rflx)   Lipid panel   PSA Total (Reflex To Free)   TSH   VITAMIN D 25 Hydroxy (Vit-D Deficiency, Fractures)   Ambulatory referral to Gastroenterology     Return in about 6 months (around 08/10/2024) for BP follow-up.    Jerrol Banana, MD, [facility]   Primary Care Sports Medicine Primary Care and Sports Medicine at [facility]

## 2024-02-11 NOTE — Assessment & Plan Note (Signed)
 He has a history of high blood pressure, which he has been managing through lifestyle changes, including increased water intake and reduced salt consumption. He has not been on blood pressure medication for two weeks and reports improved readings.  Hypertension Blood pressure well controlled off medication for the past two weeks with lifestyle modifications. -Discontinue Amlodipine. -Monitor blood pressure at home and report any symptoms of hypertension such as headaches, blurred vision, etc. -Check blood pressure at follow-up in six months.

## 2024-02-11 NOTE — Patient Instructions (Addendum)
-   Obtain fasting labs with orders provided (can have water or black coffee but otherwise no food or drink x 8 hours before labs) - Review information provided - Attend eye doctor annually, dentist every 6 months, work towards or maintain 30 minutes of moderate intensity physical activity at least 5 days per week, and consume a balanced diet - Return in 1 year for physical - Contact us for any questions between now and then   Treatment and Management Plan  Lumbar Radiculopathy: - Continue Meloxicam daily as-needed. - Use Methocarbamol and Gabapentin as needed. - Start home exercises with information below. - Consider MRI if symptoms persist or worsen to evaluate for possible intervention by a spine group.  Hypertension: - Discontinue Amlodipine. - Monitor blood pressure at home and report any symptoms of hypertension such as headaches or blurred vision. - Check blood pressure at follow-up in six months.  General Health Maintenance: - Order labs. - Consider a sleep study if there is persistent fatigue despite adequate sleep duration. - Consider Wegovy or Zepbound for weight loss if covered by insurance. - Follow-up in six months.

## 2024-02-12 ENCOUNTER — Other Ambulatory Visit: Payer: Self-pay | Admitting: Family Medicine

## 2024-02-12 ENCOUNTER — Telehealth: Payer: Self-pay | Admitting: Family Medicine

## 2024-02-12 NOTE — Telephone Encounter (Signed)
 Copied from CRM [id number]. Topic: General - Other >> Feb 12, 2024  1:22 PM Turkey B wrote: Reason for CRM: pt called in about bill he received from [facility] that he wthought would be covered. He already has spoken with [facility] but still doesn't understand why he has this bill. I also asked him to upload it on Mychart.

## 2024-02-13 LAB — COMPREHENSIVE METABOLIC PANEL
ALT: 18 [IU]/L (ref 0–44)
AST: 25 [IU]/L (ref 0–40)
Albumin: 4.4 g/dL (ref 4.1–5.1)
Alkaline Phosphatase: 107 [IU]/L (ref 44–121)
BUN/Creatinine Ratio: 14 (ref 9–20)
BUN: 14 mg/dL (ref 6–24)
Bilirubin Total: 0.4 mg/dL (ref 0.0–1.2)
CO2: 23 mmol/L (ref 20–29)
Calcium: 9.6 mg/dL (ref 8.7–10.2)
Chloride: 102 mmol/L (ref 96–106)
Creatinine, Ser: 1.03 mg/dL (ref 0.76–1.27)
Globulin, Total: 3.1 g/dL (ref 1.5–4.5)
Glucose: 85 mg/dL (ref 70–99)
Potassium: 4.4 mmol/L (ref 3.5–5.2)
Sodium: 138 mmol/L (ref 134–144)
Total Protein: 7.5 g/dL (ref 6.0–8.5)
eGFR: 90 mL/min/{1.73_m2} (ref >59)

## 2024-02-13 LAB — CBC WITH DIFFERENTIAL/PLATELET
Basophils Absolute: 0 10*3/uL (ref 0.0–0.2)
Basos: 0 %
EOS (ABSOLUTE): 0.1 10*3/uL (ref 0.0–0.4)
Eos: 2 %
Hematocrit: 41.6 % (ref 37.5–51.0)
Hemoglobin: 14.3 g/dL (ref 13.0–17.7)
Immature Grans (Abs): 0 10*3/uL (ref 0.0–0.1)
Immature Granulocytes: 0 %
Lymphocytes Absolute: 2 10*3/uL (ref 0.7–3.1)
Lymphs: 33 %
MCH: 27.7 pg (ref 26.6–33.0)
MCHC: 34.4 g/dL (ref 31.5–35.7)
MCV: 81 fL (ref 79–97)
Monocytes Absolute: 0.5 10*3/uL (ref 0.1–0.9)
Monocytes: 8 %
Neutrophils Absolute: 3.4 10*3/uL (ref 1.4–7.0)
Neutrophils: 57 %
Platelets: 268 10*3/uL (ref 150–450)
RBC: 5.17 x10E6/uL (ref 4.14–5.80)
RDW: 13.9 % (ref 11.6–15.4)
WBC: 6 10*3/uL (ref 3.4–10.8)

## 2024-02-13 LAB — LIPID PANEL
Chol/HDL Ratio: 4.6 {ratio} (ref 0.0–5.0)
Cholesterol, Total: 195 mg/dL (ref 100–199)
HDL: 42 mg/dL (ref >39)
LDL Chol Calc (NIH): 130 mg/dL — ABNORMAL HIGH (ref 0–99)
Triglycerides: 126 mg/dL (ref 0–149)
VLDL Cholesterol Cal: 23 mg/dL (ref 5–40)

## 2024-02-13 LAB — HIV ANTIBODY (ROUTINE TESTING W REFLEX): HIV Screen 4th Generation wRfx: NONREACTIVE

## 2024-02-13 LAB — PSA TOTAL (REFLEX TO FREE): Prostate Specific Ag, Serum: 0.5 ng/mL (ref 0.0–4.0)

## 2024-02-13 LAB — TSH: TSH: 1.4 u[IU]/mL (ref 0.450–4.500)

## 2024-02-13 LAB — VITAMIN D 25 HYDROXY (VIT D DEFICIENCY, FRACTURES): Vit D, 25-Hydroxy: 34.4 ng/mL (ref 30.0–100.0)

## 2024-02-13 LAB — HEPATITIS C ANTIBODY: Hep C Virus Ab: NONREACTIVE

## 2024-02-13 LAB — HEMOGLOBIN A1C
Est. average glucose Bld gHb Est-mCnc: 117 mg/dL
Hgb A1c MFr Bld: 5.7 % — ABNORMAL HIGH (ref 4.8–5.6)

## 2024-02-14 ENCOUNTER — Encounter: Payer: Self-pay | Admitting: Family Medicine

## 2024-02-14 ENCOUNTER — Other Ambulatory Visit: Payer: Self-pay | Admitting: Family Medicine

## 2024-02-14 DIAGNOSIS — E559 Vitamin D deficiency, unspecified: Secondary | ICD-10-CM

## 2024-02-14 MED ORDER — VITAMIN D (ERGOCALCIFEROL) 1.25 MG (50000 UNIT) PO CAPS: 50000.0000 [IU] | ORAL_CAPSULE | ORAL | 0 refills | Status: DC

## 2024-02-25 ENCOUNTER — Telehealth: Payer: Self-pay

## 2024-02-25 NOTE — Telephone Encounter (Signed)
 The patient schedule his colonoscopy.

## 2024-02-26 ENCOUNTER — Telehealth: Payer: Self-pay

## 2024-02-26 NOTE — Telephone Encounter (Signed)
 PT requesting call back to schedule colonoscopy

## 2024-02-26 NOTE — Telephone Encounter (Signed)
 Returned phone call to patient to schedule colonoscopy.  LVM for him to call back.  Thanks,  [place], [place]

## 2024-02-27 ENCOUNTER — Ambulatory Visit (INDEPENDENT_AMBULATORY_CARE_PROVIDER_SITE_OTHER): Admitting: Family Medicine

## 2024-02-27 VITALS — BP 130/86 | HR 77 | Ht 71.0 in | Wt 266.0 lb

## 2024-02-27 DIAGNOSIS — N50811 Right testicular pain: Secondary | ICD-10-CM

## 2024-02-27 NOTE — Telephone Encounter (Signed)
 Message left for patient to return my call.

## 2024-03-05 ENCOUNTER — Encounter: Payer: Self-pay | Admitting: Family Medicine

## 2024-03-05 DIAGNOSIS — N50811 Right testicular pain: Secondary | ICD-10-CM | POA: Insufficient documentation

## 2024-03-05 NOTE — Assessment & Plan Note (Signed)
 History of Present Illness Joshua Lam is a 48 year old male who presents with sharp pain in the right testicle.  He experiences sharp pain in the right testicle, which began while standing still during a tournament in [place] over the weekend. The pain is sudden and sharp, occurring after prolonged standing, and is localized to the groin area without radiation to the lower abdomen or legs. The pain was transient, resolving by the end of the game, but recurred briefly later.  He has a lifelong perception of an altered shape in the right testicle, described as feeling like two testicles on one side. He has experienced similar pain occasionally in the past, often after periods of sexual inactivity. The pain does not occur with lifting, straining, or bowel movements. He has not had intercourse since the pain started. He recalls having a hernia when he was younger but reports no current symptoms suggestive of a hernia.  His current medications include meloxicam, which he has been using as needed since February 17th. He also takes a variety of supplements including a powder mix with honey, cayenne, shilajit, and other ingredients, as well as citrulline, arginine, vitamin D, and a supplement for blood flow.  No urinary symptoms, fever, chills, or changes in bowel habits.  Physical Exam RANGE OF MOTION: Hip joint examined, no abnormalities noted. GENITOURINARY: Altered shape of right testis, likely normal variant. No inguinal hernia detected.  Assessment and Plan Right Testicular Pain Sharp, transient pain in the right testicle, possibly related to periods of sexual abstinence. No urinary symptoms, fever, or bowel changes. Possible epididymitis or spermatocele. -Order scrotal ultrasound to evaluate for any structural abnormalities. -Advise patient to take Meloxicam for 7 days for inflammation control. -Recommend heat/ice application and gentle massage to the area. -Check-in with patient in 1-2  weeks to assess symptom resolution.  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 No associated pain or other symptoms. -Order scrotal ultrasound to evaluate the structure and rule out any potential issues. -Continue monitoring and follow-up as necessary.

## 2024-03-05 NOTE — Progress Notes (Signed)
     Primary Care / Sports Medicine Office Visit  Patient Information:  Patient ID: Joshua Lam, male DOB: [birth year] Age: 48 y.o. MRN: [id number]   Joshua Lam is a pleasant 48 y.o. male presenting with the following:  Chief Complaint  Patient presents with   Abdominal Pain    [place]arpe pain in right low abdomin 2 x over the weekend.     Vitals:   02/27/24 1050  BP: 130/86  Pulse: 77  SpO2: 96%   Vitals:   02/27/24 1050  Weight: 266 lb (120.7 kg)  Height: 5\' 11"  (1.803 m)   Body mass index is 37.1 kg/m.  No results found.   Independent interpretation of notes and tests performed by another provider:   None  Procedures performed:   None  Pertinent History, Exam, Impression, and Recommendations:   Problem List Items Addressed This Visit     Right testicular pain - Primary   History of Present Illness Joshua Lam is a 48 year old male who presents with sharp pain in the right testicle.  He experiences sharp pain in the right testicle, which began while standing still during a tournament in [place] over the weekend. The pain is sudden and sharp, occurring after prolonged standing, and is localized to the groin area without radiation to the lower abdomen or legs. The pain was transient, resolving by the end of the game, but recurred briefly later.  He has a lifelong perception of an altered shape in the right testicle, described as feeling like two testicles on one side. He has experienced similar pain occasionally in the past, often after periods of sexual inactivity. The pain does not occur with lifting, straining, or bowel movements. He has not had intercourse since the pain started. He recalls having a hernia when he was younger but reports no current symptoms suggestive of a hernia.  His current medications include meloxicam, which he has been using as needed since February 17th. He also takes a variety of supplements including a powder mix with honey,  cayenne, shilajit, and other ingredients, as well as citrulline, arginine, vitamin D, and a supplement for blood flow.  No urinary symptoms, fever, chills, or changes in bowel habits.  Physical Exam RANGE OF MOTION: Hip joint examined, no abnormalities noted. GENITOURINARY: Altered shape of right testis, likely normal variant. No inguinal hernia detected.  Assessment and Plan Right Testicular Pain Sharp, transient pain in the right testicle, possibly related to periods of sexual abstinence. No urinary symptoms, fever, or bowel changes. Possible epididymitis or spermatocele. -Order scrotal ultrasound to evaluate for any structural abnormalities. -Advise patient to take Meloxicam for 7 days for inflammation control. -Recommend heat/ice application and gentle massage to the area. -Check-in with patient in 1-2 weeks to assess symptom resolution.  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 No associated pain or other symptoms. -Order scrotal ultrasound to evaluate the structure and rule out any potential issues. -Continue monitoring and follow-up as necessary.      Relevant Orders   US SCROTUM W/DOPPLER     Orders & Medications Medications: No orders of the defined types were placed in this encounter.  Orders Placed This Encounter  Procedures   US SCROTUM W/DOPPLER     No follow-ups on file.     Jerrol Banana, MD, [facility]   Primary Care Sports Medicine Primary Care and Sports Medicine at [facility]

## 2024-03-05 NOTE — Patient Instructions (Signed)
 VISIT SUMMARY:  Today, you were seen for sharp pain in your right testicle, which began during a tournament over the weekend. You also mentioned a lifelong perception of an altered shape in the right testicle. We discussed possible causes and planned further evaluation.  YOUR PLAN:  -RIGHT TESTICULAR PAIN: Right testicular pain can be caused by various factors, including inflammation or structural issues. We will perform a scrotal ultrasound to check for any abnormalities. In the meantime, you should take Meloxicam for 7 days to reduce inflammation, and apply heat or ice and gently massage the area to help with the pain.  -TESTICULAR SHAPE ABNORMALITY: An abnormal shape of the testicle can be due to various reasons, including congenital conditions or structural changes. We will perform a scrotal ultrasound to evaluate the structure and ensure there are no underlying issues. We will continue to monitor this condition and follow up as needed.  INSTRUCTIONS:  Please schedule a scrotal ultrasound as soon as possible. Take Meloxicam for 7 days as prescribed, and use heat or ice and gentle massage to help with the pain. We will check in with you in 1-2 weeks to see how you are doing.  For more information, you can read your full clinical note, available in your patient portal.

## 2024-03-07 ENCOUNTER — Encounter: Payer: Self-pay | Admitting: Family Medicine

## 2024-03-07 ENCOUNTER — Ambulatory Visit

## 2024-03-07 ENCOUNTER — Encounter: Payer: Self-pay | Admitting: *Deleted

## 2024-03-07 DIAGNOSIS — N50811 Right testicular pain: Secondary | ICD-10-CM | POA: Diagnosis not present

## 2024-03-10 ENCOUNTER — Other Ambulatory Visit: Payer: Self-pay | Admitting: Family Medicine

## 2024-03-10 ENCOUNTER — Encounter: Payer: Self-pay | Admitting: Family Medicine

## 2024-03-10 DIAGNOSIS — N50811 Right testicular pain: Secondary | ICD-10-CM

## 2024-03-10 NOTE — Telephone Encounter (Signed)
 Please review.  KP

## 2024-03-11 ENCOUNTER — Encounter: Payer: Self-pay | Admitting: Family Medicine

## 2024-03-11 NOTE — Telephone Encounter (Signed)
 Please review and inform patient. Please reiterate that his appt is in [facility] with [facility].

## 2024-04-21 ENCOUNTER — Ambulatory Visit: Admitting: Urology

## 2024-04-23 ENCOUNTER — Encounter: Payer: Self-pay | Admitting: Urology

## 2024-07-07 ENCOUNTER — Encounter: Payer: Self-pay | Admitting: Family Medicine

## 2024-07-07 ENCOUNTER — Ambulatory Visit (INDEPENDENT_AMBULATORY_CARE_PROVIDER_SITE_OTHER): Admitting: Family Medicine

## 2024-07-07 VITALS — BP 138/98 | HR 68 | Ht 71.0 in | Wt 259.0 lb

## 2024-07-07 DIAGNOSIS — M65931 Unspecified synovitis and tenosynovitis, right forearm: Secondary | ICD-10-CM | POA: Diagnosis not present

## 2024-07-07 MED ORDER — DICLOFENAC SODIUM 75 MG PO TBEC: 75.0000 mg | DELAYED_RELEASE_TABLET | Freq: Two times a day (BID) | ORAL | 0 refills | Status: DC

## 2024-07-07 MED ORDER — PREDNISONE 20 MG PO TABS: 20.0000 mg | ORAL_TABLET | Freq: Two times a day (BID) | ORAL | 0 refills | Status: AC

## 2024-07-07 NOTE — Patient Instructions (Signed)
 Patient Action Plan:  1. Right Wrist Extensor Tenosynovitis:    - Wear a wrist brace during the day for 1-2 weeks; optional at night.    - Take diclofenac  twice daily with food for 1 week, then as needed.    - If no improvement by mid to end of the week, start prednisone  and stop diclofenac  while on it.    - Avoid upper body exercises for 1-2 weeks; focus on core and leg exercises.    - Begin wrist-specific exercises after one week if pain improves (see MyChart for details).    - Consider gout testing if symptoms recur without activity triggers.  2. Symptoms to Watch:    - Contact the office if symptoms persist at the 2-week mark or beyond. Follow up as needed.

## 2024-07-07 NOTE — Progress Notes (Signed)
 Primary Care / Sports Medicine Office Visit  Patient Information:  Patient ID: Joshua Lam, male DOB: 08-27-1976 Age: 48 y.o. MRN: [id number]   Joshua Lam is a pleasant 48 y.o. male presenting with the following:  Chief Complaint  Patient presents with   Wrist Pain    Wrist pain and swelling started yesterday 07/06/24. Patient used ice for swelling. It helped some. Patient unable to move wrist and pain has got worse. NKI.      Vitals:   07/07/24 1102  BP: (!) 138/98  Pulse: 68  SpO2: 97%   Vitals:   07/07/24 1102  Weight: 259 lb (117.5 kg)  Height: 5' 11 (1.803 m)   Body mass index is 36.12 kg/m.  No results found.   Independent interpretation of notes and tests performed by another provider:   None  Procedures performed:   None  Pertinent History, Exam, Impression, and Recommendations:   Problem List Items Addressed This Visit     Extensor tenosynovitis of right wrist - Primary   History of Present Illness The patient presents with right wrist pain and stiffness.  Right wrist pain and stiffness - Acute onset of right wrist pain and stiffness after waking up the day following a workout - Initial symptoms included significant stiffness, progressing to inability to spread fingers or grip objects by the afternoon - Pain localized to the dorsal radial aspect of the right wrist - Pain worsens with wrist movement; unable to hang hand down or move wrist without discomfort - Right-handedness increases the impact on daily activities - No prior similar symptoms - No chest pain, shortness of breath, or palpitations  Aggravating and alleviating factors - Wrist pain exacerbated by movement and use of the right hand - Ice application provided minimal relief - No medications taken for wrist pain prior to this visit - History of meloxicam  use for other conditions, but not used for current wrist pain  Recent physical activity - Workout the day before symptom  onset included total body exercises: pushups, curls, dumbbell exercises, lat rolls, and overhead presses - No significant increase in weight or change in exercise routine  Dietary concerns and gout risk - Ate a burger the night before symptom onset - Concerned about possibility of gout, but no prior episodes or similar symptoms  Physical Exam INSPECTION: Prominent focal dorsal radial wrist swelling, no erythema, no ecchymosis. STRENGTH: Pain with resisted wrist extension. NEUROVASCULAR: Radial, median, and ulnar nerve distribution intact bilaterally.  Assessment and Plan Right wrist extensor tenosynovitis Acute tenosynovitis with swelling, likely due to relative overuse, possibly coupled with wrist wear (bracelet, wrist bands, etc.). Differential includes gout, less likely. - Prescribed wrist brace for 1-2 weeks, daytime use, optional at night. - Prescribed diclofenac  twice daily with food, take x 1 week then transition to twice daily as needed. - Prescribed prednisone  as backup if no improvement by mid to end week; hold diclofenac  while on prednisone . - Advised against upper body exercises for 1-2 weeks; focus on core and leg exercises. - Send wrist-specific exercises through MyChart to begin after one week if pain improves. - Discussed potential gout testing if symptoms recur without activity triggers. - Contact us  for any persistent symptoms at the 2-week mark or beyond, follow-up as needed      Relevant Medications   diclofenac  (VOLTAREN ) 75 MG EC tablet   predniSONE  (DELTASONE ) 20 MG tablet     Orders & Medications Medications:  Meds ordered this encounter  Medications  diclofenac  (VOLTAREN ) 75 MG EC tablet    Sig: Take 1 tablet (75 mg total) by mouth 2 (two) times daily. X 1 week then twice daily as needed.  Take with food.    Dispense:  60 tablet    Refill:  0   predniSONE  (DELTASONE ) 20 MG tablet    Sig: Take 1 tablet (20 mg total) by mouth 2 (two) times daily with a  meal for 5 days.    Dispense:  10 tablet    Refill:  0   No orders of the defined types were placed in this encounter.    No follow-ups on file.     Selinda JINNY Ku, MD, [facility]   Primary Care Sports Medicine Primary Care and Sports Medicine at [facility]

## 2024-07-07 NOTE — Assessment & Plan Note (Signed)
 History of Present Illness The patient presents with right wrist pain and stiffness.  Right wrist pain and stiffness - Acute onset of right wrist pain and stiffness after waking up the day following a workout - Initial symptoms included significant stiffness, progressing to inability to spread fingers or grip objects by the afternoon - Pain localized to the dorsal radial aspect of the right wrist - Pain worsens with wrist movement; unable to hang hand down or move wrist without discomfort - Right-handedness increases the impact on daily activities - No prior similar symptoms - No chest pain, shortness of breath, or palpitations  Aggravating and alleviating factors - Wrist pain exacerbated by movement and use of the right hand - Ice application provided minimal relief - No medications taken for wrist pain prior to this visit - History of meloxicam  use for other conditions, but not used for current wrist pain  Recent physical activity - Workout the day before symptom onset included total body exercises: pushups, curls, dumbbell exercises, lat rolls, and overhead presses - No significant increase in weight or change in exercise routine  Dietary concerns and gout risk - Ate a burger the night before symptom onset - Concerned about possibility of gout, but no prior episodes or similar symptoms  Physical Exam INSPECTION: Prominent focal dorsal radial wrist swelling, no erythema, no ecchymosis. STRENGTH: Pain with resisted wrist extension. NEUROVASCULAR: Radial, median, and ulnar nerve distribution intact bilaterally.  Assessment and Plan Right wrist extensor tenosynovitis Acute tenosynovitis with swelling, likely due to relative overuse, possibly coupled with wrist wear (bracelet, wrist bands, etc.). Differential includes gout, less likely. - Prescribed wrist brace for 1-2 weeks, daytime use, optional at night. - Prescribed diclofenac  twice daily with food, take x 1 week then transition  to twice daily as needed. - Prescribed prednisone  as backup if no improvement by mid to end week; hold diclofenac  while on prednisone . - Advised against upper body exercises for 1-2 weeks; focus on core and leg exercises. - Send wrist-specific exercises through MyChart to begin after one week if pain improves. - Discussed potential gout testing if symptoms recur without activity triggers. - Contact us  for any persistent symptoms at the 2-week mark or beyond, follow-up as needed

## 2024-08-07 ENCOUNTER — Other Ambulatory Visit: Payer: Self-pay | Admitting: Family Medicine

## 2024-08-07 DIAGNOSIS — M65931 Unspecified synovitis and tenosynovitis, right forearm: Secondary | ICD-10-CM

## 2024-08-11 NOTE — Telephone Encounter (Signed)
 Requested medication (s) are due for refill today - unsure  Requested medication (s) are on the active medication list -yes  Future visit scheduled -no  Last refill: 07/07/24 #60  Notes to clinic: acute Rx- sent for review of RF request  Requested Prescriptions  Pending Prescriptions Disp Refills   diclofenac  (VOLTAREN ) 75 MG EC tablet [Pharmacy Med Name: Diclofenac  Sodium 75 MG Oral Tablet Delayed Release] 60 tablet 0    Sig: TAKE 1 TABLET BY MOUTH TWICE DAILY FOR  ONE  WEEK  THEN  TWICE  DAILY  AS  NEEDED  TAKE  WITH  FOOD     Analgesics:  NSAIDS Failed - 08/11/2024  1:58 PM      Failed - Manual Review: Labs are only required if the patient has taken medication for more than 8 weeks.      Passed - Cr in normal range and within 360 days    Creatinine, Ser  Date Value Ref Range Status  02/12/2024 1.03 0.76 - 1.27 mg/dL Final         Passed - HGB in normal range and within 360 days    Hemoglobin  Date Value Ref Range Status  02/12/2024 14.3 13.0 - 17.7 g/dL Final         Passed - PLT in normal range and within 360 days    Platelets  Date Value Ref Range Status  02/12/2024 268 150 - 450 x10E3/uL Final         Passed - HCT in normal range and within 360 days    Hematocrit  Date Value Ref Range Status  02/12/2024 41.6 37.5 - 51.0 % Final         Passed - eGFR is 30 or above and within 360 days    GFR, Estimated  Date Value Ref Range Status  12/28/2023 >60 >60 mL/min Final    Comment:    (NOTE) Calculated using the CKD-EPI Creatinine Equation (2021)    eGFR  Date Value Ref Range Status  02/12/2024 90 >59 mL/min/1.73 Final         Passed - Patient is not pregnant      Passed - Valid encounter within last 12 months    Recent Outpatient Visits           1 month ago Extensor tenosynovitis of right wrist   [place] [facility], Selinda PARAS, MD   5 months ago Right testicular pain   [place] [facility], Selinda PARAS, MD   6 months ago Healthcare maintenance   [facility] at [facility], Selinda PARAS, MD                 Requested Prescriptions  Pending Prescriptions Disp Refills   diclofenac  (VOLTAREN ) 75 MG EC tablet [Pharmacy Med Name: Diclofenac  Sodium 75 MG Oral Tablet Delayed Release] 60 tablet 0    Sig: TAKE 1 TABLET BY MOUTH TWICE DAILY FOR  ONE  WEEK  THEN  TWICE  DAILY  AS  NEEDED  TAKE  WITH  FOOD     Analgesics:  NSAIDS Failed - 08/11/2024  1:58 PM      Failed - Manual Review: Labs are only required if the patient has taken medication for more than 8 weeks.      Passed - Cr in normal range and within 360 days    Creatinine, Ser  Date Value Ref Range Status  02/12/2024 1.03 0.76 - 1.27 mg/dL Final         Passed - HGB in normal range and within 360 days    Hemoglobin  Date Value Ref Range Status  02/12/2024 14.3 13.0 - 17.7 g/dL Final         Passed - PLT in normal range and within 360 days    Platelets  Date Value Ref Range Status  02/12/2024 268 150 - 450 x10E3/uL Final         Passed - HCT in normal range and within 360 days    Hematocrit  Date Value Ref Range Status  02/12/2024 41.6 37.5 - 51.0 % Final         Passed - eGFR is 30 or above and within 360 days    GFR, Estimated  Date Value Ref Range Status  12/28/2023 >60 >60 mL/min Final    Comment:    (NOTE) Calculated using the CKD-EPI Creatinine Equation (2021)    eGFR  Date Value Ref Range Status  02/12/2024 90 >59 mL/min/1.73 Final         Passed - Patient is not pregnant      Passed - Valid encounter within last 12 months    Recent Outpatient Visits           1 month ago Extensor tenosynovitis of right wrist   [place] [facility], Selinda PARAS, MD   5 months ago Right testicular pain   [place] [facility],  Selinda PARAS, MD   6 months ago Healthcare maintenance   [facility] at [facility], Selinda PARAS, MD

## 2024-10-28 ENCOUNTER — Encounter: Payer: Self-pay | Admitting: Family Medicine

## 2024-10-29 ENCOUNTER — Other Ambulatory Visit: Payer: Self-pay

## 2024-10-29 DIAGNOSIS — M4727 Other spondylosis with radiculopathy, lumbosacral region: Secondary | ICD-10-CM

## 2024-10-29 MED ORDER — METHOCARBAMOL 500 MG PO TABS: 500.0000 mg | ORAL_TABLET | Freq: Two times a day (BID) | ORAL | 0 refills | Status: DC | PRN

## 2024-10-29 NOTE — Telephone Encounter (Signed)
 Please review and advise.  JM

## 2024-11-06 ENCOUNTER — Ambulatory Visit: Payer: Self-pay | Admitting: Family Medicine

## 2024-11-06 ENCOUNTER — Ambulatory Visit
Admission: RE | Admit: 2024-11-06 | Discharge: 2024-11-06 | Disposition: A | Discharge status: Home / Self Care | Attending: Family Medicine | Admitting: Family Medicine

## 2024-11-06 ENCOUNTER — Ambulatory Visit
Admission: RE | Admit: 2024-11-06 | Discharge: 2024-11-06 | Disposition: A | Discharge status: Home / Self Care | Source: Ambulatory Visit | Attending: Family Medicine | Admitting: Family Medicine

## 2024-11-06 ENCOUNTER — Encounter: Payer: Self-pay | Admitting: Family Medicine

## 2024-11-06 ENCOUNTER — Ambulatory Visit (INDEPENDENT_AMBULATORY_CARE_PROVIDER_SITE_OTHER): Admitting: Family Medicine

## 2024-11-06 VITALS — BP 134/88 | HR 75 | Ht 71.0 in | Wt 258.0 lb

## 2024-11-06 DIAGNOSIS — M4307 Spondylolysis, lumbosacral region: Secondary | ICD-10-CM

## 2024-11-06 MED ORDER — HYDROCODONE-ACETAMINOPHEN 7.5-325 MG PO TABS: 1.0000 | ORAL_TABLET | Freq: Three times a day (TID) | ORAL | 0 refills | Status: AC | PRN

## 2024-11-06 MED ORDER — TIZANIDINE HCL 4 MG PO TABS: 4.0000 mg | ORAL_TABLET | Freq: Three times a day (TID) | ORAL | 0 refills | Status: DC | PRN

## 2024-11-06 MED ORDER — PREDNISONE 50 MG PO TABS: 50.0000 mg | ORAL_TABLET | Freq: Every day | ORAL | 0 refills | Status: DC

## 2024-11-06 MED ORDER — MELOXICAM 15 MG PO TABS: 15.0000 mg | ORAL_TABLET | Freq: Every day | ORAL | 0 refills | Status: DC

## 2024-11-06 NOTE — Patient Instructions (Signed)
 VISIT SUMMARY:  You visited us  today due to severe lower back pain following a recent injury while exercising. We discussed your symptoms, including the impact on your daily activities, and planned several steps to manage your pain and assess your condition further.  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 This is causing severe pain and affecting your daily activities. -We have ordered an MRI of your lumbar spine to check for any shifts or further injury. -You will take prednisone  once daily with food for 5 days to reduce inflammation. -After finishing the prednisone , you will take meloxicam  once daily with food to maintain the anti-inflammatory effect. -You can take tizanidine as needed, preferably at night, to help with muscle spasms. -Hydrocodone has been prescribed for pain management as needed. -We have also ordered a lumbar spine x-ray to assess your current status. -Avoid activities that load your spine until further notice. -Once your pain is under control, we recommend starting physical therapy to strengthen your muscles and support your spine. -We will coordinate a follow-up appointment after we get your MRI results.

## 2024-11-06 NOTE — Assessment & Plan Note (Signed)
 History of Present Illness Joshua Lam is a 48 year old male with a history of L5 pars defect who presents with persistent lower back pain following a recent injury.  Lumbar back pain - Severe lower back pain since November 4th, 2025, following bend over rows with a 44-pound kettlebell - Pain described as spasm and locking sensation in the lower back, initially preventing him from standing up straight - Pain characterized as 'somebody digging into my spine' - Pain is equal on both sides of the spine - Exacerbated by sitting up straight - Somewhat relieved by leaning forward - Sneezing can cause pain depending on the time of day - No numbness, tingling, or shooting pain down the legs - No bowel or bladder dysfunction  Functional impairment - Unable to perform certain physical activities, such as bridges - Walks with a limp due to pain - 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 Normal lumbar alignment and posture. PALPATION: Nontender along the midline lumbar spinous processes. Nontender at the left and right sacroiliac joints. Nontender throughout the paraspinal lumbar region. No step-offs or bony abnormalities. RANGE OF MOTION: Flexion, extension, and lateral bending within normal limits; discomfort reported with extension and left lateral bending. STRENGTH: 5/5 strength in bilateral lower extremities. NEUROLOGICAL: Sensation intact in bilateral lower extremities. No sensorimotor deficits. SPECIAL TESTS: Positive  Kemp's test bilaterally, more pronounced on the left. Negative straight leg raise bilaterally. Negative FABER test bilaterally. Pain reproduced during left-sided testing. Positive iliopsoas testing bilaterall.  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 Severe pain affecting daily activities. No neurological deficits. Positive bilateral Kemp's test, left more pronounced. MRI needed to assess for listhesis and/or further injury. Current focus on symptom control and rehabilitation. - Ordered MRI of lumbar spine to further assess lumbar spine. - Prescribed prednisone  for 5 days, once daily with food, to reduce inflammation. - Prescribed meloxicam  once daily with food after prednisone  course to maintain anti-inflammatory effect. - Prescribed tizanidine as needed, preferably at night due to drowsiness. - Prescribed hydrocodone for pain management as needed. - Ordered lumbar spine x-ray to assess current status. - Advised against activities loading the spine until further notice. - Recommended physical therapy to strengthen muscles and support spine once pain is controlled. - Coordinated follow-up after MRI results.

## 2024-11-06 NOTE — Progress Notes (Signed)
 Primary Care / Sports Medicine Office Visit  Patient Information:  Patient ID: Joshua Lam, male DOB: [birth year] Age: 48 y.o. MRN: [id number]   Joshua Lam is a pleasant 48 y.o. male presenting with the following:  Chief Complaint  Patient presents with   Back Pain    Low back pain since 10/28/24. Patient was working out when he felt a pull on left side he immediately stopped working out when he felt the pull. He went to chiropractor last week and earlier this week but did not get any relief from session. He has wore a back brace to help support his back since the injury. He has taken methocarbamol   but still hasn't had any significant relief.     Vitals:   11/06/24 1037  BP: 134/88  Pulse: 75  SpO2: 98%   Vitals:   11/06/24 1037  Weight: 258 lb (117 kg)  Height: 5' 11 (1.803 m)   Body mass index is 35.98 kg/m.  No results found.   Discussed the use of AI scribe software for clinical note transcription with the patient, who gave verbal consent to proceed.   Independent interpretation of notes and tests performed by another provider:   None  Procedures performed:   None  Pertinent History, Exam, Impression, and Recommendations:   Problem List Items Addressed This Visit     Spondylolysis of lumbosacral region - Primary   History of Present Illness Joshua Lam is a 48 year old male with a history of L5 pars defect who presents with persistent lower back pain following a recent injury.  Lumbar back pain - Severe lower back pain since November 4th, 2025, following bend over rows with a 44-pound kettlebell - Pain described as spasm and locking sensation in the lower back, initially preventing him from standing up straight - Pain characterized as 'somebody digging into my spine' - Pain is equal on both sides of the spine - Exacerbated by sitting up straight - Somewhat relieved by leaning forward - Sneezing can cause pain depending on the time of day - No  numbness, tingling, or shooting pain down the legs - No bowel or bladder dysfunction  Functional impairment - Unable to perform certain physical activities, such as bridges - Walks with a limp due to pain - 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 Normal lumbar alignment and posture. PALPATION: Nontender along the midline lumbar spinous processes. Nontender at the left and right sacroiliac joints. Nontender throughout the paraspinal lumbar region. No step-offs or bony abnormalities. RANGE OF MOTION: Flexion, extension, and lateral bending within normal limits; discomfort reported with extension and left lateral bending. STRENGTH: 5/5 strength in bilateral lower extremities. NEUROLOGICAL: Sensation intact in bilateral lower extremities. No sensorimotor deficits. SPECIAL TESTS: Positive Kemp's test bilaterally, more pronounced on the left. Negative straight leg raise bilaterally. Negative FABER test bilaterally. Pain reproduced during left-sided testing. Positive iliopsoas testing bilaterall.  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  Severe pain affecting daily activities. No neurological deficits. Positive bilateral Kemp's test, left more pronounced. MRI needed to assess for listhesis and/or further injury. Current focus on symptom control and rehabilitation. - Ordered MRI of lumbar  spine to further assess lumbar spine. - Prescribed prednisone  for 5 days, once daily with food, to reduce inflammation. - Prescribed meloxicam  once daily with food after prednisone  course to maintain anti-inflammatory effect. - Prescribed tizanidine as needed, preferably at night due to drowsiness. - Prescribed hydrocodone for pain management as needed. - Ordered lumbar spine x-ray to assess current status. - Advised against activities loading the spine until further notice. - Recommended physical therapy to strengthen muscles and support spine once pain is controlled. - Coordinated follow-up after MRI results.      Relevant Medications   predniSONE  (DELTASONE ) 50 MG tablet   meloxicam  (MOBIC ) 15 MG tablet   tiZANidine (ZANAFLEX) 4 MG tablet   HYDROcodone-acetaminophen  (NORCO) 7.5-325 MG tablet   Other Relevant Orders   DG Lumbar Spine Complete     Orders & Medications Medications:  Meds ordered this encounter  Medications   predniSONE  (DELTASONE ) 50 MG tablet    Sig: Take 1 tablet (50 mg total) by mouth daily.    Dispense:  5 tablet    Refill:  0   meloxicam  (MOBIC ) 15 MG tablet    Sig: Take 1 tablet (15 mg total) by mouth daily.    Dispense:  30 tablet    Refill:  0   tiZANidine (ZANAFLEX) 4 MG tablet    Sig: Take 1 tablet (4 mg total) by mouth every 8 (eight) hours as needed for muscle spasms.    Dispense:  30 tablet    Refill:  0   HYDROcodone-acetaminophen  (NORCO) 7.5-325 MG tablet    Sig: Take 1 tablet by mouth every 8 (eight) hours as needed.    Dispense:  15 tablet    Refill:  0   Orders Placed This Encounter  Procedures   DG Lumbar Spine Complete     No follow-ups on file.     Selinda JINNY Ku, MD, [facility]   Primary Care Sports Medicine [facility] at [facility]

## 2024-11-12 ENCOUNTER — Emergency Department (HOSPITAL_COMMUNITY)
Admission: EM | Admit: 2024-11-12 | Discharge: 2024-11-13 | Disposition: A | Discharge status: Home / Self Care | Attending: Emergency Medicine | Admitting: Emergency Medicine

## 2024-11-12 ENCOUNTER — Encounter (HOSPITAL_COMMUNITY): Payer: Self-pay

## 2024-11-12 ENCOUNTER — Other Ambulatory Visit: Payer: Self-pay

## 2024-11-12 ENCOUNTER — Ambulatory Visit (HOSPITAL_COMMUNITY)
Admission: RE | Admit: 2024-11-12 | Discharge: 2024-11-12 | Disposition: A | Discharge status: Home / Self Care | Source: Ambulatory Visit | Attending: Family Medicine | Admitting: Family Medicine

## 2024-11-12 DIAGNOSIS — F419 Anxiety disorder, unspecified: Secondary | ICD-10-CM | POA: Diagnosis present

## 2024-11-12 DIAGNOSIS — M4307 Spondylolysis, lumbosacral region: Secondary | ICD-10-CM

## 2024-11-12 NOTE — ED Triage Notes (Signed)
 Pt reports his face feels flushed and he can't relax since he went for a lower lumbar MRI today but was unable to have it done due to claustrophobia. He states when he tries to go to sleep he can't because he keeps thinking about going back in the machine. Denies any pain anywhere.

## 2024-11-13 MED ORDER — HYDROXYZINE HCL 25 MG PO TABS
25.0000 mg | ORAL_TABLET | Freq: Once | ORAL | Status: AC
  Administered 2024-11-13: 25 mg via ORAL
  Filled 2024-11-13: qty 1

## 2024-11-13 NOTE — Telephone Encounter (Signed)
 FYI  KP

## 2024-11-13 NOTE — Telephone Encounter (Signed)
 Please review and advise.  JM

## 2024-11-13 NOTE — Discharge Instructions (Signed)
 You were seen this evening for anxiety.  Please follow-up with your primary care team for further management as needed.  Return to the emergency department if you develop any life-threatening symptoms

## 2024-11-13 NOTE — Telephone Encounter (Signed)
 Please send in medication so patient can have MRI. Thank you.  JM

## 2024-11-13 NOTE — ED Provider Notes (Signed)
 Joshua Lam EMERGENCY DEPARTMENT AT [facility] Provider Note   CSN: [id number] Arrival date & time: 11/12/24  2349     Patient presents with: Anxiety   Joshua Lam is a 48 y.o. male.  Patient presents to the emergency room complaining of an anxiety attack.  He states that he had an MRI scheduled earlier today but was unable to complete the MRI due to claustrophobia and anxiety.  He states that upon going home he continued to feel some anxious feelings and this evening we tried to go to sleep he kept feeling like he was being enclosed in the MRI machine.  He felt flushed and had flushed feeling on the side of his face and side of his neck.  He came to the emergency department for evaluation.  At the time of my assessment he states the flushed feeling has dissipated.  He is no longer feeling as anxious but still reports mild anxiety.  He tells me he has contacted his primary care provider by secure chat to request anxiety medication prior to going for the repeat MRI.  He denies chest pain, shortness of breath, other systemic symptoms.    Anxiety       Prior to Admission medications   Medication Sig Start Date End Date Taking? Authorizing Provider  fluticasone  (FLONASE ) 50 MCG/ACT nasal spray Place 2 sprays into both nostrils daily. 06/13/15   Jarold Olam HERO, PA-C  HYDROcodone-acetaminophen  (NORCO) 7.5-325 MG tablet Take 1 tablet by mouth every 8 (eight) hours as needed. 11/06/24   Matthews, Jason J, MD  meloxicam  (MOBIC ) 15 MG tablet Take 1 tablet (15 mg total) by mouth daily. 11/06/24   Matthews, Jason J, MD  predniSONE  (DELTASONE ) 50 MG tablet Take 1 tablet (50 mg total) by mouth daily. 11/06/24   Matthews, Jason J, MD  tiZANidine (ZANAFLEX) 4 MG tablet Take 1 tablet (4 mg total) by mouth every 8 (eight) hours as needed for muscle spasms. 11/06/24   Matthews, Jason J, MD    Allergies: Patient has no known allergies.    Review of Systems  Updated Vital Signs BP (!)  155/107 (BP Location: Right Arm)   Pulse 62   Temp 98 F (36.7 C)   Resp 16   Ht 5' 11 (1.803 m)   Wt 116.1 kg   SpO2 97%   BMI 35.70 kg/m   Physical Exam Vitals and nursing note reviewed.  HENT:     Head: Normocephalic and atraumatic.  Eyes:     Pupils: Pupils are equal, round, and reactive to light.  Cardiovascular:     Rate and Rhythm: Normal rate and regular rhythm.  Pulmonary:     Effort: Pulmonary effort is normal. No respiratory distress.     Breath sounds: Normal breath sounds.  Musculoskeletal:        General: No signs of injury.     Cervical back: Normal range of motion.  Skin:    General: Skin is dry.  Neurological:     Mental Status: He is alert.  Psychiatric:        Speech: Speech normal.     Comments: Patient endorsing anxiety     (all labs ordered are listed, but only abnormal results are displayed) Labs Reviewed - No data to display  EKG: None  Radiology: No results found.   Procedures   Medications Ordered in the ED  hydrOXYzine (ATARAX) tablet 25 mg (has no administration in time range)  Medical Decision Making  This patient presents to the ED for concern of anxiety, this involves an extensive number of treatment options, and is a complaint that carries with it a high risk of complications and morbidity.    Co morbidities / Chronic conditions that complicate the patient evaluation  History of anxiety   Additional history obtained:  Additional history obtained from EMR  Problem List / ED Course / Critical interventions / Medication management   I ordered medication including hydroxyzine Reevaluation of the patient after these medicines showed that the patient improved I have reviewed the patients home medicines and have made adjustments as needed   Test / Admission - Considered:  Patient with anxiety which was induced by attempted MRI earlier today.  He states he is starting to feel  better but is requesting a medication to help him get over the hump and go to sleep this evening.  Hydroxyzine ordered with improvement in symptoms.  Patient with known anxiety.  Patient with no chest pain, shortness of breath, or other systemic symptoms.  I see no indication at this time for further emergent workup or admission.  Patient stable for discharge home with outpatient follow-up with primary care team      Final diagnoses:  Anxiety    ED Discharge Orders     None          Logan Ubaldo KATHEE DEVONNA 11/13/24 0309    Midge Golas, MD 11/13/24 [phone]

## 2024-11-18 NOTE — Telephone Encounter (Signed)
 Please review.  KP

## 2024-11-24 ENCOUNTER — Ambulatory Visit (INDEPENDENT_AMBULATORY_CARE_PROVIDER_SITE_OTHER): Admitting: Family Medicine

## 2024-11-24 ENCOUNTER — Encounter: Payer: Self-pay | Admitting: Family Medicine

## 2024-11-24 DIAGNOSIS — M4307 Spondylolysis, lumbosacral region: Secondary | ICD-10-CM | POA: Diagnosis not present

## 2024-11-24 MED ORDER — TIZANIDINE HCL 4 MG PO TABS: 4.0000 mg | ORAL_TABLET | Freq: Three times a day (TID) | ORAL | 0 refills | Status: AC | PRN

## 2024-11-24 MED ORDER — MELOXICAM 15 MG PO TABS: 15.0000 mg | ORAL_TABLET | Freq: Every day | ORAL | 0 refills | Status: AC

## 2024-11-24 NOTE — Patient Instructions (Signed)
 VISIT SUMMARY:  Today, we discussed your back pain and physical activity limitations due to spondylolysis. We also addressed your restless legs and anxiety, and reviewed your medication use for pain and muscle spasms.  YOUR PLAN:  LUMBOSACRAL SPONDYLOLYSIS: You have a chronic condition with recent improvement and no significant pain or neurological symptoms. Mild tightness reported. -You are referred to physical therapy for spinal stabilization exercises. -Continue using tizanidine  as needed for muscle tightness. -Use meloxicam  for inflammation and pain management, especially on event days. -Focus on proper exercise techniques, emphasizing flexibility, stability, and form. -A note for officiating clearance has been provided.  MEDICATION USE FOR PAIN AND MUSCLE SPASMS: You have tapered off your previous medication regimen but keep hydrocodone  available for severe pain as a backup. You use meloxicam  as needed for inflammation and tizanidine  as needed for muscle tightness and stiffness. -Continue using hydrocodone  only for severe pain. -Use meloxicam  as needed for inflammation. -Use tizanidine  as needed for muscle tightness and stiffness.

## 2024-11-24 NOTE — Progress Notes (Signed)
 Primary Care / Sports Medicine Office Visit  Patient Information:  Patient ID: Joshua Lam, male DOB: 1976/09/25 Age: 48 y.o. MRN: [id number]   Joshua Lam is a pleasant 48 y.o. male presenting with the following:  Chief Complaint  Patient presents with   Back Pain    Low back pain has decreased since last visit. Patient is wondering if MRI is necessary since he is not having the pain like he was when he first came in.     Vitals:   11/24/24 1401 11/24/24 1409  BP: (!) 140/70 116/68  Pulse: 79   SpO2: 95%    Vitals:   11/24/24 1401  Weight: 260 lb (117.9 kg)  Height: 5' 11 (1.803 m)   Body mass index is 36.26 kg/m.  DG Lumbar Spine Complete Result Date: 11/06/2024 EXAM: 4 VIEW(S) XRAY OF THE LUMBAR SPINE 11/06/2024 11:46:22 AM COMPARISON: Lumbar spine series dated 05/03/2022. CLINICAL HISTORY: eval L5 pars defect seen in 2023, severe exacerbation in symptoms FINDINGS: LUMBAR SPINE: BONES: No acute fracture. No aggressive appearing osseous lesion. Alignment is normal. There is a questionable chronic pars defect present on the right L5, similar to the prior study although it was previously described on the left. The left pars articularis remains intact. There is no spondylolisthesis. DISCS AND DEGENERATIVE CHANGES: There is mild chronic degenerative disc disease at L4-L5. SOFT TISSUES: No acute abnormality. IMPRESSION: 1. Questionable chronic right L5 pars defect, similar to prior study, although previously described on the left. The left pars articularis remains intact. 2. 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 3. No spondylolisthesis. Electronically signed by: Evalene Coho MD 11/06/2024 12:25 PM EST RP Workstation: [id number]     Discussed the use of AI scribe software for clinical note transcription with the patient, who gave verbal consent to proceed.   Independent interpretation of notes and tests performed by another provider:   None  Procedures  performed:   None  Pertinent History, Exam, Impression, and Recommendations:   History of Present Illness Joshua Lam is a 48 year old male with spondylolysis who presents for follow-up regarding his back pain and physical activity limitations.  Lumbar back pain and functional limitations - Diagnosed with spondylolysis - Adhering to a regimen of walking; has avoided gym activities - Able to perform squatting and lateral movements without pain - Experiences low back tightness, attributed to lack of stretching - No pain radiating down the legs - No numbness or tingling - Occasional morning stiffness, especially after sleeping on a couch at work  Medication use for pain and muscle spasms - Tapered off previous medication regimen - Keeps hydrocodone  available for severe pain as a backup - Uses meloxicam  as needed for inflammation - Uses tizanidine  as needed for muscle tightness and stiffness  Physical Exam SPECIAL TESTS: Negative straight leg raise bilaterally. Equivocal to positive Stork test left, contralateral benign, sensorimotor intact bilateral lower extremities.  Assessment and Plan Lumbosacral spondylolysis Chronic condition with recent improvement. No significant pain or neurological symptoms. Mild tightness due to inactivity. No surgical intervention or advanced imaging needed. Focus on core strengthening. - Referred to physical therapy for spinal stabilization exercises. - Prescribed tizanidine  as needed for muscle tightness. - Prescribed meloxicam  for inflammation and pain management, especially on event days. - Advised on proper exercise techniques, emphasizing flexibility, stability, and form. - Canceled MRI appointment. - Provided note for officiating clearance.  Problem List Items Addressed This Visit     Spondylolysis of lumbosacral region  Relevant Medications   meloxicam  (MOBIC ) 15 MG tablet   tiZANidine  (ZANAFLEX ) 4 MG tablet     Orders &  Medications Medications:  Meds ordered this encounter  Medications   meloxicam  (MOBIC ) 15 MG tablet    Sig: Take 1 tablet (15 mg total) by mouth daily.    Dispense:  30 tablet    Refill:  0   tiZANidine  (ZANAFLEX ) 4 MG tablet    Sig: Take 1 tablet (4 mg total) by mouth every 8 (eight) hours as needed for muscle spasms.    Dispense:  90 tablet    Refill:  0   No orders of the defined types were placed in this encounter.    No follow-ups on file.     Selinda JINNY Ku, MD, [facility]   Primary Care Sports Medicine Primary Care and Sports Medicine at [facility]

## 2024-11-26 ENCOUNTER — Ambulatory Visit (HOSPITAL_COMMUNITY)

## 2024-12-12 ENCOUNTER — Other Ambulatory Visit: Payer: Self-pay

## 2024-12-12 ENCOUNTER — Ambulatory Visit: Attending: Family Medicine

## 2024-12-12 DIAGNOSIS — M5416 Radiculopathy, lumbar region: Secondary | ICD-10-CM | POA: Insufficient documentation

## 2024-12-12 DIAGNOSIS — M4307 Spondylolysis, lumbosacral region: Secondary | ICD-10-CM | POA: Diagnosis not present

## 2024-12-12 NOTE — Therapy (Signed)
 " OUTPATIENT PHYSICAL THERAPY EVALUATION   Patient Name: Joshua Lam MRN: [id number] DOB:[birth year], 48 y.o., male Today's Date: 12/12/2024  END OF SESSION:  PT End of Session - 12/12/2024      Visit Number 1   Number of Visits 16   Date for Recertification 02/06/2025    Authorization Type [employer]    Authorization - Visit Number 1   Authorization - Number of Visits 20    PT Start Time 0918    PT Stop Time 0955    PT Time Calculation (min) 37 min     Behavior During Therapy [facility] for tasks assessed/performed      Past Medical History:  Diagnosis Date   Hypertension    Past Surgical History:  Procedure Laterality Date   UMBILICAL HERNIA REPAIR  1980   Patient Active Problem List   Diagnosis Date Noted   Spondylolysis of lumbosacral region 11/06/2024   Extensor tenosynovitis of right wrist 07/07/2024   Right testicular pain 03/05/2024   Healthcare maintenance 02/11/2024   Primary hypertension 02/11/2024   Bilateral primary osteoarthritis of hip 01/05/2024   Lumbosacral spondylosis with radiculopathy 01/05/2024    PCP: Alvia Selinda PARAS, MD  REFERRING PROVIDER: Alvia Selinda PARAS, MD  REFERRING DIAG: [phone] (ICD-10-CM) - Spondylolysis of lumbosacral region  Rationale for Evaluation and Treatment: Rehabilitation  THERAPY DIAG:  Radiculopathy, lumbar region  ONSET DATE: 10/29/2024  SUBJECTIVE:                                                                                                                                                                                           SUBJECTIVE STATEMENT: Patient presents to PT today with low back pain that has been present 10/29/2024. He states that he was doing KB rows when he hurt his back recently. He does have history of R sided sciatica. He states that he is aware that he needs to work on his core strength and losing about 20 pounds. He also wants to work on his posture. He denies radicular symptoms.   PERTINENT  HISTORY:  Relevant PMHx includes HTN, lumbosacral spondylolysis with radiculopathy, BIL hip OA  PAIN:  Are you having pain?  Yes: NPRS scale: 0/10 current, 10/10 worst Pain location: BIL lower back  Pain description: sharp, aching Aggravating factors: prolonged sitting, standing, improper footwear with physical activity Relieving factors: meds as needed, supportive footwear   PRECAUTIONS: None  RED FLAGS: None   WEIGHT BEARING RESTRICTIONS: No  FALLS:  Has patient fallen in last 6 months? No  LIVING ENVIRONMENT: Lives with: lives alone Lives in: House/apartment Stairs: Yes: External: 1 flight  steps; can reach both  Has following equipment at home: None  OCCUPATION: sports officiating, otherwise works sedentary jobs   PLOF: Independent  PATIENT GOALS: Get rid of the back pain, be flexible enough   NEXT MD VISIT: not scheduled at time of eval  OBJECTIVE:  Note: Objective measures were completed at Evaluation unless otherwise noted.  DIAGNOSTIC FINDINGS:  11/06/2024 Lumbar Spine XRAY  IMPRESSION: 1. Questionable chronic right L5 pars defect, similar to prior study, although previously described on the left. The left pars articularis remains intact. 2. 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 3. No spondylolisthesis.   Electronically signed by: Evalene Coho MD 11/06/2024 12:25 PM  PATIENT SURVEYS:  Modified Oswestry:  MODIFIED OSWESTRY DISABILITY SCALE  Date: 12/12/2024 Score  Pain intensity 2 =  Pain medication provides me with complete relief from pain.  2. Personal care (washing, dressing, etc.) 0 =  I can take care of myself normally without causing increased pain.  3. Lifting 0 = I can lift heavy weights without increased pain.  4. Walking 0 = Pain does not prevent me from walking any distance  5. Sitting 1 =  I can only sit in my favorite chair as long as I like.  6. Standing 1 =  I can stand as long as I want but, it increases my pain.  7.  Sleeping 0 = Pain does not prevent me from sleeping well.  8. Social Life 0 = My social life is normal and does not increase my pain.  9. Traveling 0 =  I can travel anywhere without increased pain.  10. Employment/ Homemaking 0 = My normal homemaking/job activities do not cause pain.  Total 4/50   Interpretation of scores: Score Category Description  0-20% Minimal Disability The patient can cope with most living activities. Usually no treatment is indicated apart from advice on lifting, sitting and exercise  21-40% Moderate Disability The patient experiences more pain and difficulty with sitting, lifting and standing. Travel and social life are more difficult and they may be disabled from work. Personal care, sexual activity and sleeping are not grossly affected, and the patient can usually be managed by conservative means  41-60% Severe Disability Pain remains the main problem in this group, but activities of daily living are affected. These patients require a detailed investigation  61-80% Crippled Back pain impinges on all aspects of the patients life. Positive intervention is required  81-100% Bed-bound These patients are either bed-bound or exaggerating their symptoms  Bluford FORBES Zoe DELENA Karon DELENA, et al. Surgery versus conservative management of stable thoracolumbar fracture: the PRESTO feasibility RCT. [place] ([place]): [employer]; 2021 Nov. [facility] Technology Assessment, No. 25.62.) Appendix 3, Oswestry Disability Index category descriptors. Available from: Findjewelers.cz  Minimally Clinically Important Difference (MCID) = 12.8%  COGNITION: Overall cognitive status: Within functional limits for tasks assessed     SENSATION: Not tested  POSTURE: Mild shoulder rounding   LUMBAR ROM:   AROM eval  Flexion WFL  Extension WFL  Right lateral flexion WFL  Left lateral flexion WFL  Right rotation WFL  Left rotation WFL   (Blank rows =  not tested)  UPPER EXTREMITY MMT:  MMT Right eval Left eval  Shoulder flexion 4 4  Shoulder extension    Shoulder abduction 4+ 4+  Shoulder adduction    Shoulder extension    Shoulder internal rotation    Shoulder external rotation    Middle trapezius 4- 4-  Lower trapezius 3+ 3+  Elbow flexion  Elbow extension    Wrist flexion    Wrist extension    Wrist ulnar deviation    Wrist radial deviation    Wrist pronation    Wrist supination    Grip strength     (Blank rows = not tested)   LOWER EXTREMITY MMT:    MMT Right eval Left eval  Hip flexion 4+ 4+  Hip extension    Hip abduction 4+ 4+  Hip adduction    Hip internal rotation    Hip external rotation    Knee flexion 4+ 4+  Knee extension 4+ 4+  Ankle dorsiflexion    Ankle plantarflexion    Ankle inversion    Ankle eversion     (Blank rows = not tested)   CORE STRENGTH Double Limb Lowering Test for core/abdominal mm strength assessment    12/12/2024: 60 degrees  Angle Rating 90 very poor, starting position 75 poor 60 below average 45 average 30 above average 15 good 0 excellent, legs horizontal   LUMBAR SPECIAL TESTS:  Straight leg raise test: Negative    TREATMENT DATE:   [facility] Adult PT Treatment:                                                DATE: 12/12/2024   Initial evaluation: see patient education and home exercise program as noted below                                                                                                                                  PATIENT EDUCATION:  Education details: reviewed initial home exercise program; discussion of POC, prognosis and goals for skilled PT   Person educated: Patient Education method: Explanation, Demonstration, and Handouts Education comprehension: verbalized understanding, returned demonstration, and needs further education  HOME EXERCISE PROGRAM: Access Code: [email or web address] URL: [email or web address] Date:  12/12/2024 Prepared by: Marko Molt  Exercises - Supine 90/90 Alternating Heel Touches with Posterior Pelvic Tilt  - 1 x daily - 4 x weekly - 2 sets - 10 reps - Supine Bridge  - 1 x daily - 4 x weekly - 2 sets - 10 reps - Active Straight Leg Raise with Quad Set  - 1 x daily - 4 x weekly - 2 sets - 10 reps - Clamshell  - 1 x daily - 4 x weekly - 2 sets - 10 reps - Standing Shoulder Horizontal Abduction with Resistance  - 1 x daily - 4 x weekly - 2 sets - 10 reps  ASSESSMENT:  CLINICAL IMPRESSION: Joshua Lam is a 48 y.o. male  who was seen today for physical therapy evaluation and treatment for persistent Low Back Pain with radiating pain to R LE. He is demonstrating decreased core strength, as well as decreased BIL UE MMT and decreased LE MMT.  He has related pain and difficulty with prolonged sitting and standing as required for normal occupational duties. He requires skilled PT services at this time to address relevant deficits and improve overall function.     OBJECTIVE IMPAIRMENTS: decreased activity tolerance, decreased strength, improper body mechanics, postural dysfunction, and pain.   ACTIVITY LIMITATIONS: carrying, lifting, bending, sitting, standing, and squatting  PARTICIPATION LIMITATIONS: meal prep, cleaning, community activity, and occupation  PERSONAL FACTORS: 3+ comorbidities: Relevant PMHx includes HTN, lumbosacral spondylolysis with radiculopathy, BIL hip OA are also affecting patient's functional outcome.   REHAB POTENTIAL: Fair    CLINICAL DECISION MAKING: Stable/uncomplicated  EVALUATION COMPLEXITY: Low   GOALS: Goals reviewed with patient? YES  SHORT TERM GOALS: Target date: 01/09/2025   Patient will be independent with initial home program at least 3 days/week.  Baseline: provided at eval Goal Status: INITIAL   2.  Patient will demonstrate improved postural awareness for at least 15 minutes while seated without need for cueing from PT.  Baseline: see  objective measures Goal Status: INITIAL   3.  Patient will demonstrate at least 4+/5 BIL UE MMT  Baseline: see objective measures  Goal status: INITIAL    LONG TERM GOALS: Target date: 02/06/2025   Patient will report improved overall functional ability with modified ODI score of 0/50.  Baseline: 4/50 Goal Status: INITIAL   2.  Patient will report ability to tolerate sitting/driving for at least 1 hour, with minimal symptoms.  Baseline:  Goal status: INITIAL   3.  Patient will report no more than 3/10 pain at end of their workday, using ergonomic modifications and HEP as needed.  Baseline: up to 10/10 at end of day Goal status: INITIAL    4.   Patient will demonstrate at least 80% confidence with safe return to independent strength training program.   Baseline: 0%   Goal status: INITIAL       PLAN:  PT FREQUENCY: 1-2x/week  PT DURATION: 8 weeks  PLANNED INTERVENTIONS: 97164- PT Re-evaluation, 97750- Physical Performance Testing, 97110-Therapeutic exercises, 97530- Therapeutic activity, [id number]- Neuromuscular re-education, 97535- Self Care, [id number]- Manual therapy, [id number]- Aquatic Therapy, [id number]- Electrical stimulation (unattended), [id number]- Electrical stimulation (manual), [id number]- Traction (mechanical), 20560 (1-2 muscles), 20561 (3+ muscles)- Dry Needling, Patient/Family education, Taping, Spinal mobilization, Cryotherapy, and Moist heat.  PLAN FOR NEXT SESSION: address core stabilization, lumbopelvic mobility, UE/LE strengthening   Marko Molt, PT, DPT  12/19/2024 9:57 AM  "

## 2024-12-29 NOTE — Therapy (Incomplete)
 " OUTPATIENT PHYSICAL THERAPY EVALUATION   Patient Name: Joshua Lam MRN: [id number] DOB:10/09/1976, 49 y.o., male Today's Date: 12/29/2024  END OF SESSION:  PT End of Session - 12/12/2024      Visit Number 1   Number of Visits 16   Date for Recertification 02/06/2025    Authorization Type [employer]    Authorization - Visit Number 1   Authorization - Number of Visits 20    PT Start Time 0918    PT Stop Time 0955    PT Time Calculation (min) 37 min     Behavior During Therapy [facility] for tasks assessed/performed      Past Medical History:  Diagnosis Date   Hypertension    Past Surgical History:  Procedure Laterality Date   UMBILICAL HERNIA REPAIR  1980   Patient Active Problem List   Diagnosis Date Noted   Spondylolysis of lumbosacral region 11/06/2024   Extensor tenosynovitis of right wrist 07/07/2024   Right testicular pain 03/05/2024   Healthcare maintenance 02/11/2024   Primary hypertension 02/11/2024   Bilateral primary osteoarthritis of hip 01/05/2024   Lumbosacral spondylosis with radiculopathy 01/05/2024    PCP: Alvia Selinda PARAS, MD  REFERRING PROVIDER: Alvia Selinda PARAS, MD  REFERRING DIAG: [phone] (ICD-10-CM) - Spondylolysis of lumbosacral region  Rationale for Evaluation and Treatment: Rehabilitation  THERAPY DIAG:  No diagnosis found.  ONSET DATE: 10/29/2024  SUBJECTIVE:                                                                                                                                                                                           SUBJECTIVE STATEMENT: ***    Patient presents to PT today with low back pain that has been present 10/29/2024. He states that he was doing KB rows when he hurt his back recently. He does have history of R sided sciatica. He states that he is aware that he needs to work on his core strength and losing about 20 pounds. He also wants to work on his posture. He denies radicular symptoms.   PERTINENT HISTORY:   Relevant PMHx includes HTN, lumbosacral spondylolysis with radiculopathy, BIL hip OA  PAIN:  Are you having pain?  Yes: NPRS scale: 0/10 current, 10/10 worst Pain location: BIL lower back  Pain description: sharp, aching Aggravating factors: prolonged sitting, standing, improper footwear with physical activity Relieving factors: meds as needed, supportive footwear   PRECAUTIONS: None  RED FLAGS: None   WEIGHT BEARING RESTRICTIONS: No  FALLS:  Has patient fallen in last 6 months? No  LIVING ENVIRONMENT: Lives with: lives alone Lives in: House/apartment Stairs: Yes: External: 1 flight  steps; can reach both Has following equipment at home: None  OCCUPATION: sports officiating, otherwise works sedentary jobs   PLOF: Independent  PATIENT GOALS: Get rid of the back pain, be flexible enough   NEXT MD VISIT: not scheduled at time of eval  OBJECTIVE:  Note: Objective measures were completed at Evaluation unless otherwise noted.  DIAGNOSTIC FINDINGS:  11/06/2024 Lumbar Spine XRAY  IMPRESSION: 1. Questionable chronic right L5 pars defect, similar to prior study, although previously described on the left. The left pars articularis remains intact. 2. 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 3. No spondylolisthesis.   Electronically signed by: Evalene Coho MD 11/06/2024 12:25 PM  PATIENT SURVEYS:  Modified Oswestry:  MODIFIED OSWESTRY DISABILITY SCALE  Date: 12/12/2024 Score  Pain intensity 2 =  Pain medication provides me with complete relief from pain.  2. Personal care (washing, dressing, etc.) 0 =  I can take care of myself normally without causing increased pain.  3. Lifting 0 = I can lift heavy weights without increased pain.  4. Walking 0 = Pain does not prevent me from walking any distance  5. Sitting 1 =  I can only sit in my favorite chair as long as I like.  6. Standing 1 =  I can stand as long as I want but, it increases my pain.  7. Sleeping 0 =  Pain does not prevent me from sleeping well.  8. Social Life 0 = My social life is normal and does not increase my pain.  9. Traveling 0 =  I can travel anywhere without increased pain.  10. Employment/ Homemaking 0 = My normal homemaking/job activities do not cause pain.  Total 4/50   Interpretation of scores: Score Category Description  0-20% Minimal Disability The patient can cope with most living activities. Usually no treatment is indicated apart from advice on lifting, sitting and exercise  21-40% Moderate Disability The patient experiences more pain and difficulty with sitting, lifting and standing. Travel and social life are more difficult and they may be disabled from work. Personal care, sexual activity and sleeping are not grossly affected, and the patient can usually be managed by conservative means  41-60% Severe Disability Pain remains the main problem in this group, but activities of daily living are affected. These patients require a detailed investigation  61-80% Crippled Back pain impinges on all aspects of the patients life. Positive intervention is required  81-100% Bed-bound These patients are either bed-bound or exaggerating their symptoms  Bluford FORBES Zoe DELENA Karon DELENA, et al. Surgery versus conservative management of stable thoracolumbar fracture: the PRESTO feasibility RCT. [place] ([place]): [employer]; 2021 Nov. [facility] Technology Assessment, No. 25.62.) Appendix 3, Oswestry Disability Index category descriptors. Available from: Findjewelers.cz  Minimally Clinically Important Difference (MCID) = 12.8%  COGNITION: Overall cognitive status: Within functional limits for tasks assessed     SENSATION: Not tested  POSTURE: Mild shoulder rounding   LUMBAR ROM:   AROM eval  Flexion WFL  Extension WFL  Right lateral flexion WFL  Left lateral flexion WFL  Right rotation WFL  Left rotation WFL   (Blank rows = not  tested)  UPPER EXTREMITY MMT:  MMT Right eval Left eval  Shoulder flexion 4 4  Shoulder extension    Shoulder abduction 4+ 4+  Shoulder adduction    Shoulder extension    Shoulder internal rotation    Shoulder external rotation    Middle trapezius 4- 4-  Lower trapezius 3+ 3+  Elbow  flexion    Elbow extension    Wrist flexion    Wrist extension    Wrist ulnar deviation    Wrist radial deviation    Wrist pronation    Wrist supination    Grip strength     (Blank rows = not tested)   LOWER EXTREMITY MMT:    MMT Right eval Left eval  Hip flexion 4+ 4+  Hip extension    Hip abduction 4+ 4+  Hip adduction    Hip internal rotation    Hip external rotation    Knee flexion 4+ 4+  Knee extension 4+ 4+  Ankle dorsiflexion    Ankle plantarflexion    Ankle inversion    Ankle eversion     (Blank rows = not tested)   CORE STRENGTH Double Limb Lowering Test for core/abdominal mm strength assessment    12/12/2024: 60 degrees  Angle Rating 90 very poor, starting position 75 poor 60 below average 45 average 30 above average 15 good 0 excellent, legs horizontal   LUMBAR SPECIAL TESTS:  Straight leg raise test: Negative    TREATMENT DATE:   [facility] Adult PT Treatment:                                                DATE: 12/29/2024  Therapeutic Exercise: Nustep  Standing abd iso with pball press down  Manual Therapy: *** Neuromuscular re-ed: Supine 90/90 heel taps Supine bridge SLR  Clamshell Shoulder abduction  PPT + knee fall outs  Glute bridge with ball squeeze and PPT Therapeutic Activity: *** Modalities: *** Self Care: ***    [facility] Adult PT Treatment:                                                DATE: 12/12/2024   Initial evaluation: see patient education and home exercise program as noted below                                                                                                                                  PATIENT  EDUCATION:  Education details: reviewed initial home exercise program; discussion of POC, prognosis and goals for skilled PT   Person educated: Patient Education method: Explanation, Demonstration, and Handouts Education comprehension: verbalized understanding, returned demonstration, and needs further education  HOME EXERCISE PROGRAM: Access Code: [email or web address] URL: [email or web address] Date: 12/12/2024 Prepared by: Marko Molt  Exercises - Supine 90/90 Alternating Heel Touches with Posterior Pelvic Tilt  - 1 x daily - 4 x weekly - 2 sets - 10 reps - Supine Bridge  - 1 x daily - 4 x weekly - 2 sets - 10 reps -  Active Straight Leg Raise with Quad Set  - 1 x daily - 4 x weekly - 2 sets - 10 reps - Clamshell  - 1 x daily - 4 x weekly - 2 sets - 10 reps - Standing Shoulder Horizontal Abduction with Resistance  - 1 x daily - 4 x weekly - 2 sets - 10 reps  ASSESSMENT:  CLINICAL IMPRESSION: ***  Preciliano is a 49 y.o. male  who was seen today for physical therapy evaluation and treatment for persistent Low Back Pain with radiating pain to R LE. He is demonstrating decreased core strength, as well as decreased BIL UE MMT and decreased LE MMT. He has related pain and difficulty with prolonged sitting and standing as required for normal occupational duties. He requires skilled PT services at this time to address relevant deficits and improve overall function.     OBJECTIVE IMPAIRMENTS: decreased activity tolerance, decreased strength, improper body mechanics, postural dysfunction, and pain.   ACTIVITY LIMITATIONS: carrying, lifting, bending, sitting, standing, and squatting  PARTICIPATION LIMITATIONS: meal prep, cleaning, community activity, and occupation  PERSONAL FACTORS: 3+ comorbidities: Relevant PMHx includes HTN, lumbosacral spondylolysis with radiculopathy, BIL hip OA are also affecting patient's functional outcome.   REHAB POTENTIAL: Fair    CLINICAL DECISION MAKING:  Stable/uncomplicated  EVALUATION COMPLEXITY: Low   GOALS: Goals reviewed with patient? YES  SHORT TERM GOALS: Target date: 01/09/2025   Patient will be independent with initial home program at least 3 days/week.  Baseline: provided at eval Goal Status: INITIAL   2.  Patient will demonstrate improved postural awareness for at least 15 minutes while seated without need for cueing from PT.  Baseline: see objective measures Goal Status: INITIAL   3.  Patient will demonstrate at least 4+/5 BIL UE MMT  Baseline: see objective measures  Goal status: INITIAL    LONG TERM GOALS: Target date: 02/06/2025   Patient will report improved overall functional ability with modified ODI score of 0/50.  Baseline: 4/50 Goal Status: INITIAL   2.  Patient will report ability to tolerate sitting/driving for at least 1 hour, with minimal symptoms.  Baseline:  Goal status: INITIAL   3.  Patient will report no more than 3/10 pain at end of their workday, using ergonomic modifications and HEP as needed.  Baseline: up to 10/10 at end of day Goal status: INITIAL    4.   Patient will demonstrate at least 80% confidence with safe return to independent strength training program.   Baseline: 0%   Goal status: INITIAL       PLAN:  PT FREQUENCY: 1-2x/week  PT DURATION: 8 weeks  PLANNED INTERVENTIONS: [id number]- PT Re-evaluation, 97750- Physical Performance Testing, 97110-Therapeutic exercises, 97530- Therapeutic activity, [id number]- Neuromuscular re-education, 97535- Self Care, [id number]- Manual therapy, [id number]- Aquatic Therapy, [id number]- Electrical stimulation (unattended), [id number]- Electrical stimulation (manual), [id number]- Traction (mechanical), 20560 (1-2 muscles), 20561 (3+ muscles)- Dry Needling, Patient/Family education, Taping, Spinal mobilization, Cryotherapy, and Moist heat.  PLAN FOR NEXT SESSION: address core stabilization, lumbopelvic mobility, UE/LE strengthening   Marko Molt, PT, DPT  12/29/2024  5:56 PM  "

## 2024-12-30 ENCOUNTER — Ambulatory Visit

## 2025-01-05 ENCOUNTER — Ambulatory Visit

## 2025-01-16 ENCOUNTER — Ambulatory Visit

## 2025-01-23 ENCOUNTER — Ambulatory Visit
